# Patient Record
Sex: Female | Born: 1945 | Race: Black or African American | Hispanic: No | Smoking: Never smoker
Health system: Southern US, Community
[De-identification: ages and names within clinical notes are randomized; demographics above are authoritative.]

## PROBLEM LIST (undated history)

## (undated) DIAGNOSIS — I10 Essential (primary) hypertension: Secondary | ICD-10-CM

## (undated) DIAGNOSIS — E119 Type 2 diabetes mellitus without complications: Secondary | ICD-10-CM

## (undated) DIAGNOSIS — G5602 Carpal tunnel syndrome, left upper limb: Secondary | ICD-10-CM

## (undated) DIAGNOSIS — E039 Hypothyroidism, unspecified: Secondary | ICD-10-CM

## (undated) DIAGNOSIS — H269 Unspecified cataract: Secondary | ICD-10-CM

## (undated) DIAGNOSIS — M1990 Unspecified osteoarthritis, unspecified site: Secondary | ICD-10-CM

## (undated) DIAGNOSIS — E785 Hyperlipidemia, unspecified: Secondary | ICD-10-CM

## (undated) DIAGNOSIS — R011 Cardiac murmur, unspecified: Secondary | ICD-10-CM

## (undated) HISTORY — PX: JOINT REPLACEMENT: SHX530

## (undated) HISTORY — DX: Hyperlipidemia, unspecified: E78.5

## (undated) HISTORY — DX: Unspecified osteoarthritis, unspecified site: M19.90

## (undated) HISTORY — DX: Unspecified cataract: H26.9

## (undated) HISTORY — PX: TUBAL LIGATION: SHX77

---

## 2000-03-28 HISTORY — PX: ROTATOR CUFF REPAIR: SHX139

## 2001-03-28 HISTORY — PX: TOTAL KNEE ARTHROPLASTY: SHX125

## 2007-03-29 HISTORY — PX: CARPAL TUNNEL RELEASE: SHX101

## 2007-07-12 LAB — HM COLONOSCOPY

## 2011-03-29 HISTORY — PX: ROTATOR CUFF REPAIR: SHX139

## 2013-05-02 ENCOUNTER — Other Ambulatory Visit (HOSPITAL_COMMUNITY): Payer: Self-pay | Admitting: Family Medicine

## 2013-05-02 DIAGNOSIS — M810 Age-related osteoporosis without current pathological fracture: Secondary | ICD-10-CM

## 2013-05-02 DIAGNOSIS — Z139 Encounter for screening, unspecified: Secondary | ICD-10-CM

## 2013-05-07 ENCOUNTER — Ambulatory Visit (HOSPITAL_COMMUNITY): Payer: Self-pay

## 2013-05-09 ENCOUNTER — Other Ambulatory Visit (HOSPITAL_COMMUNITY): Payer: Self-pay

## 2013-05-09 ENCOUNTER — Ambulatory Visit (HOSPITAL_COMMUNITY): Payer: Self-pay

## 2013-05-13 ENCOUNTER — Ambulatory Visit (HOSPITAL_COMMUNITY): Payer: Self-pay

## 2013-05-14 ENCOUNTER — Other Ambulatory Visit (HOSPITAL_COMMUNITY): Payer: Self-pay

## 2013-05-14 ENCOUNTER — Ambulatory Visit (HOSPITAL_COMMUNITY): Payer: Self-pay

## 2013-05-23 ENCOUNTER — Other Ambulatory Visit (HOSPITAL_COMMUNITY): Payer: Self-pay

## 2013-05-30 ENCOUNTER — Ambulatory Visit (HOSPITAL_COMMUNITY)
Admission: RE | Admit: 2013-05-30 | Discharge: 2013-05-30 | Disposition: A | Discharge status: Home / Self Care | Payer: PRIVATE HEALTH INSURANCE | Source: Ambulatory Visit | Attending: Family Medicine | Admitting: Family Medicine

## 2013-05-30 DIAGNOSIS — M818 Other osteoporosis without current pathological fracture: Secondary | ICD-10-CM | POA: Insufficient documentation

## 2013-05-30 DIAGNOSIS — Z1231 Encounter for screening mammogram for malignant neoplasm of breast: Secondary | ICD-10-CM | POA: Insufficient documentation

## 2013-05-30 DIAGNOSIS — Z780 Asymptomatic menopausal state: Secondary | ICD-10-CM | POA: Insufficient documentation

## 2013-05-30 DIAGNOSIS — M810 Age-related osteoporosis without current pathological fracture: Secondary | ICD-10-CM

## 2013-05-30 DIAGNOSIS — Z139 Encounter for screening, unspecified: Secondary | ICD-10-CM

## 2013-07-11 ENCOUNTER — Other Ambulatory Visit (HOSPITAL_COMMUNITY): Payer: Self-pay | Admitting: Family Medicine

## 2013-07-11 ENCOUNTER — Ambulatory Visit (HOSPITAL_COMMUNITY)
Admission: RE | Admit: 2013-07-11 | Discharge: 2013-07-11 | Disposition: A | Discharge status: Home / Self Care | Payer: PRIVATE HEALTH INSURANCE | Source: Ambulatory Visit | Attending: Family Medicine | Admitting: Family Medicine

## 2013-07-11 DIAGNOSIS — R209 Unspecified disturbances of skin sensation: Secondary | ICD-10-CM | POA: Insufficient documentation

## 2013-07-11 DIAGNOSIS — M542 Cervicalgia: Secondary | ICD-10-CM

## 2013-07-11 DIAGNOSIS — M47812 Spondylosis without myelopathy or radiculopathy, cervical region: Secondary | ICD-10-CM | POA: Insufficient documentation

## 2013-07-11 DIAGNOSIS — R29898 Other symptoms and signs involving the musculoskeletal system: Secondary | ICD-10-CM | POA: Insufficient documentation

## 2014-01-13 ENCOUNTER — Ambulatory Visit (HOSPITAL_COMMUNITY)
Admission: RE | Admit: 2014-01-13 | Discharge: 2014-01-13 | Disposition: A | Discharge status: Home / Self Care | Payer: PRIVATE HEALTH INSURANCE | Source: Ambulatory Visit | Attending: Nurse Practitioner | Admitting: Nurse Practitioner

## 2014-01-13 DIAGNOSIS — R079 Chest pain, unspecified: Secondary | ICD-10-CM | POA: Insufficient documentation

## 2014-01-13 DIAGNOSIS — I081 Rheumatic disorders of both mitral and tricuspid valves: Secondary | ICD-10-CM | POA: Insufficient documentation

## 2014-01-13 DIAGNOSIS — E119 Type 2 diabetes mellitus without complications: Secondary | ICD-10-CM | POA: Insufficient documentation

## 2014-01-13 DIAGNOSIS — I517 Cardiomegaly: Secondary | ICD-10-CM

## 2014-01-13 DIAGNOSIS — I10 Essential (primary) hypertension: Secondary | ICD-10-CM | POA: Insufficient documentation

## 2014-01-13 DIAGNOSIS — E785 Hyperlipidemia, unspecified: Secondary | ICD-10-CM | POA: Diagnosis not present

## 2014-01-13 NOTE — Progress Notes (Signed)
  Echocardiogram 2D Echocardiogram has been performed.  Kathleen [name] 01/13/2014, 11:29 AM

## 2014-02-17 ENCOUNTER — Encounter: Payer: Self-pay | Admitting: Internal Medicine

## 2014-02-17 ENCOUNTER — Ambulatory Visit (INDEPENDENT_AMBULATORY_CARE_PROVIDER_SITE_OTHER): Payer: PRIVATE HEALTH INSURANCE | Admitting: Internal Medicine

## 2014-02-17 VITALS — BP 138/90 | HR 83 | Ht 62.0 in | Wt 182.0 lb

## 2014-02-17 DIAGNOSIS — R0602 Shortness of breath: Secondary | ICD-10-CM

## 2014-02-17 NOTE — Progress Notes (Signed)
HPI Patinet is a 68 yo who is referred for CP.  Patinet has a history of DM, HL, HTN, Vit D deficiency  She is Jehovah's witness Echo in October 2015 showed LVEF 60 to 65% Moderate LVH     Pain comes on at rest  Pain is not often  Starts as soft discomfort  Then heaviness.  Last a couple min.  Not pleuritic.  Have gone on for about a year  Again not often  None with activity.  No PND Not too active   Doesn't walk much  Gets SOB  SOB  Started this year   2 maternal uncles with heart disease.  Mother developed in later years Not on File  Current Outpatient Prescriptions  Medication Sig Dispense Refill  . amLODipine-benazepril (LOTREL) 5-20 MG per capsule Take 1 capsule by mouth daily.     Marland Kitchen. aspirin 81 MG tablet Take 81 mg by mouth daily.    . Calcium Carbonate-Vitamin D (CALCIUM 500/D PO) Take by mouth.    . Cholecalciferol (VITAMIN D3) 2000 UNITS TABS Take by mouth.    Marland Kitchen. JANUMET 50-500 MG per tablet Take 1 tablet by mouth daily.     Marland Kitchen. levothyroxine (SYNTHROID, LEVOTHROID) 100 MCG tablet Take 100 mcg by mouth daily before breakfast.     . simvastatin (ZOCOR) 20 MG tablet Take 20 mg by mouth daily.     Marland Kitchen. triamterene-hydrochlorothiazide (DYAZIDE) 37.5-25 MG per capsule Take 1 capsule by mouth daily.      No current facility-administered medications for this visit.    No past medical history on file.  No past surgical history on file.  Knee surgery replaced bilateral  Shoulder surgery  R carpel tunnel    No family history on file.  History   Social History  . Marital Status: Divorced    Spouse Name: N/A    Number of Children: N/A  . Years of Education: N/A   Occupational History  . Not on file.   Social History Main Topics  . Smoking status: Never Smoker   . Smokeless tobacco: Never Used  . Alcohol Use: Not on file  . Drug Use: Not on file  . Sexual Activity: Not on file   Other Topics Concern  . Not on file   Social History Narrative  . No narrative on file    Review  of Systems:  All systems reviewed.  They are negative to the above problem except as previously stated.  Vital Signs: BP 138/90 mmHg  Pulse 83  Ht 5\' 2"  (1.575 m)  Wt 182 lb (82.555 kg)  BMI 33.28 kg/m2  Physical Exam patinet is in NAD    NT:  Normocephalic, atraumatic. EOMI, PERRLA.  Neck: JVP is normal.  No bruits.  Lungs: clear to auscultation. No rales no wheezes.  Heart: Regular rate and rhythm. Normal S1, S2. No S3.   II/VI systlic murmur at apex and base  PMI not displaced.  Abdomen:  Supple, nontender. Normal bowel sounds. No masses. No hepatomegaly.  Extremities:   Good distal pulses throughout. No lower extremity edema.  Musculoskeletal :moving all extremities.  Neuro:   alert and oriented x3.  CN II-XII grossly intact.  EKG  Not done  WIll get at stress tst    Assessment and Plan:  1  CP  Atypical  I do  Not think cardiac   2.  Dypsnea  More concerning  Discussed with patinet  Would recomm Lexiscan myoview to r/o ischemia  3  HTN  BP is borderline  WIll need to be followe  4.  HL  Do not have lipids  Continue simvistatin

## 2014-02-17 NOTE — Patient Instructions (Signed)
Your physician recommends that you schedule a follow-up appointment in: to be determined after test    Your physician recommends that you continue on your current medications as directed. Please refer to the Current Medication list given to you today.    Your physician has requested that you have a lexiscan myoview. For further information please visit [email or web address]. Please follow instruction sheet, as given.     Thank you for choosing [facility] !         

## 2014-02-28 ENCOUNTER — Encounter (HOSPITAL_COMMUNITY)
Admission: RE | Admit: 2014-02-28 | Discharge: 2014-02-28 | Disposition: A | Discharge status: Home / Self Care | Payer: PRIVATE HEALTH INSURANCE | Source: Ambulatory Visit | Attending: Internal Medicine | Admitting: Internal Medicine

## 2014-02-28 ENCOUNTER — Ambulatory Visit (HOSPITAL_COMMUNITY)
Admission: RE | Admit: 2014-02-28 | Discharge: 2014-02-28 | Disposition: A | Discharge status: Home / Self Care | Payer: PRIVATE HEALTH INSURANCE | Source: Ambulatory Visit | Attending: Internal Medicine | Admitting: Internal Medicine

## 2014-02-28 ENCOUNTER — Encounter (HOSPITAL_COMMUNITY): Payer: Self-pay

## 2014-02-28 DIAGNOSIS — R0789 Other chest pain: Secondary | ICD-10-CM | POA: Insufficient documentation

## 2014-02-28 DIAGNOSIS — E119 Type 2 diabetes mellitus without complications: Secondary | ICD-10-CM | POA: Diagnosis not present

## 2014-02-28 DIAGNOSIS — R079 Chest pain, unspecified: Secondary | ICD-10-CM | POA: Diagnosis not present

## 2014-02-28 DIAGNOSIS — I10 Essential (primary) hypertension: Secondary | ICD-10-CM | POA: Insufficient documentation

## 2014-02-28 DIAGNOSIS — R0602 Shortness of breath: Secondary | ICD-10-CM | POA: Insufficient documentation

## 2014-02-28 DIAGNOSIS — E785 Hyperlipidemia, unspecified: Secondary | ICD-10-CM | POA: Diagnosis not present

## 2014-02-28 MED ORDER — TECHNETIUM TC 99M SESTAMIBI - CARDIOLITE
10.0000 | Freq: Once | INTRAVENOUS | Status: AC | PRN
  Administered 2014-02-28: 09:00:00 10 via INTRAVENOUS

## 2014-02-28 MED ORDER — TECHNETIUM TC 99M SESTAMIBI GENERIC - CARDIOLITE
30.0000 | Freq: Once | INTRAVENOUS | Status: AC | PRN
  Administered 2014-02-28: 30 via INTRAVENOUS

## 2014-02-28 MED ORDER — SODIUM CHLORIDE 0.9 % IJ SOLN
10.0000 mL | INTRAMUSCULAR | Status: DC | PRN
  Administered 2014-02-28: 10 mL via INTRAVENOUS
  Filled 2014-02-28: qty 10

## 2014-02-28 MED ORDER — REGADENOSON 0.4 MG/5ML IV SOLN
0.4000 mg | Freq: Once | INTRAVENOUS | Status: AC
  Administered 2014-02-28: 0.4 mg via INTRAVENOUS

## 2014-02-28 NOTE — Progress Notes (Signed)
Stress Lab Nurses Notes - [facility]  Margot AblesBetty O Putnam 02/28/2014 Reason for doing test: Chest Pain Type of test: Marlane HatcherLexiscan Cardiolite Nurse performing test: Santiago BurElizabeth Ashli Selders, RN Nuclear Medicine Tech: Lyndel Pleasureyan Liles Echo Tech: Not Applicable MD performing test: S. McDowell/K. Lyman BishopLawrence, NP Family MD: Dr. Tenny Crawoss Test explained and consent signed: Yes.   IV started: Saline lock flushed Symptoms: Chest tightness and stomach discomfort. Treatment/Intervention: None Reason test stopped: protocol completed After recovery IV was: Discontinued via X-ray tech and No redness or edema Patient to return to Nuc. Med at : 1200 Patient discharged: Home Patient's Condition upon discharge was: stable Comments: during test BP 127/66 & HR 92.  Recovery BP 132/62 & HR 78. Symptoms resolved in recovery.  Bosie HelperHandy, Amaryllis Malmquist Noel

## 2014-04-11 DIAGNOSIS — I700 Atherosclerosis of aorta: Secondary | ICD-10-CM | POA: Diagnosis not present

## 2014-04-11 DIAGNOSIS — I10 Essential (primary) hypertension: Secondary | ICD-10-CM | POA: Diagnosis not present

## 2014-04-11 DIAGNOSIS — E785 Hyperlipidemia, unspecified: Secondary | ICD-10-CM | POA: Diagnosis not present

## 2014-04-11 DIAGNOSIS — R079 Chest pain, unspecified: Secondary | ICD-10-CM | POA: Diagnosis not present

## 2014-04-11 DIAGNOSIS — E119 Type 2 diabetes mellitus without complications: Secondary | ICD-10-CM | POA: Diagnosis not present

## 2014-06-19 NOTE — Patient Instructions (Signed)
Your procedure is scheduled on: 06/26/2014  [facility]  [facility]  AM.  Call this number if you have problems the morning of surgery: [phone]   Do not eat food or drink liquids :After Midnight.      Take these medicines the morning of surgery with A SIP OF WATER: synthroid, dyazide, amlodipine   Do not wear jewelry, make-up or nail polish.  Do not wear lotions, powders, or perfumes.   Do not shave 48 hours prior to surgery.  Do not bring valuables to the hospital.  Contacts, dentures or bridgework may not be worn into surgery.  Leave suitcase in the car. After surgery it may be brought to your room.  For patients admitted to the hospital, checkout time is 11:00 AM the day of discharge.   Patients discharged the day of surgery will not be allowed to drive home.  :     Please read over the following fact sheets that you were given: Coughing and Deep Breathing, Surgical Site Infection Prevention, Anesthesia Post-op Instructions and Care and Recovery After Surgery    Cataract A cataract is a clouding of the lens of the eye. When a lens becomes cloudy, vision is reduced based on the degree and nature of the clouding. Many cataracts reduce vision to some degree. Some cataracts make people more near-sighted as they develop. Other cataracts increase glare. Cataracts that are ignored and become worse can sometimes look white. The white color can be seen through the pupil. CAUSES   Aging. However, cataracts may occur at any age, even in newborns.   Certain drugs.   Trauma to the eye.   Certain diseases such as diabetes.   Specific eye diseases such as chronic inflammation inside the eye or a sudden attack of a rare form of glaucoma.   Inherited or acquired medical problems.  SYMPTOMS   Gradual, progressive drop in vision in the affected eye.   Severe, rapid visual loss. This most often happens when trauma is the cause.  DIAGNOSIS  To detect a cataract, an eye doctor  examines the lens. Cataracts are best diagnosed with an exam of the eyes with the pupils enlarged (dilated) by drops.  TREATMENT  For an early cataract, vision may improve by using different eyeglasses or stronger lighting. If that does not help your vision, surgery is the only effective treatment. A cataract needs to be surgically removed when vision loss interferes with your everyday activities, such as driving, reading, or watching TV. A cataract may also have to be removed if it prevents examination or treatment of another eye problem. Surgery removes the cloudy lens and usually replaces it with a substitute lens (intraocular lens, IOL).  At a time when both you and your doctor agree, the cataract will be surgically removed. If you have cataracts in both eyes, only one is usually removed at a time. This allows the operated eye to heal and be out of danger from any possible problems after surgery (such as infection or poor wound healing). In rare cases, a cataract may be doing damage to your eye. In these cases, your caregiver may advise surgical removal right away. The vast majority of people who have cataract surgery have better vision afterward. HOME CARE INSTRUCTIONS  If you are not planning surgery, you may be asked to do the following:  Use different eyeglasses.   Use stronger or brighter lighting.   Ask your eye doctor about reducing your medicine dose or changing  medicines if it is thought that a medicine caused your cataract. Changing medicines does not make the cataract go away on its own.   Become familiar with your surroundings. Poor vision can lead to injury. Avoid bumping into things on the affected side. You are at a higher risk for tripping or falling.   Exercise extreme care when driving or operating machinery.   Wear sunglasses if you are sensitive to bright [place] or experiencing problems with glare.  SEEK IMMEDIATE MEDICAL CARE IF:   You have a worsening or sudden vision  loss.   You notice redness, swelling, or increasing pain in the eye.   You have a fever.  Document Released: 03/14/2005 Document Revised: 03/03/2011 Document Reviewed: 11/05/2010 [facility] Patient Information 2012 [email or web address] INSTRUCTIONS POST-ANESTHESIA  IMMEDIATELY FOLLOWING SURGERY:  Do not drive or operate machinery for the first twenty four hours after surgery.  Do not make any important decisions for twenty four hours after surgery or while taking narcotic pain medications or sedatives.  If you develop intractable nausea and vomiting or a severe headache please notify your doctor immediately.  FOLLOW-UP:  Please make an appointment with your surgeon as instructed. You do not need to follow up with anesthesia unless specifically instructed to do so.  WOUND CARE INSTRUCTIONS (if applicable):  Keep a dry clean dressing on the anesthesia/puncture wound site if there is drainage.  Once the wound has quit draining you may leave it open to air.  Generally you should leave the bandage intact for twenty four hours unless there is drainage.  If the epidural site drains for more than 36-48 hours please call the anesthesia department.  QUESTIONS?:  Please feel free to call your physician or the hospital operator if you have any questions, and they will be happy to assist you.

## 2014-06-24 ENCOUNTER — Encounter (HOSPITAL_COMMUNITY)
Admission: RE | Admit: 2014-06-24 | Discharge: 2014-06-24 | Disposition: A | Discharge status: Home / Self Care | Payer: Medicare Other | Source: Ambulatory Visit | Attending: Ophthalmology | Admitting: Ophthalmology

## 2014-06-24 ENCOUNTER — Encounter (HOSPITAL_COMMUNITY): Payer: Self-pay

## 2014-06-24 ENCOUNTER — Other Ambulatory Visit: Payer: Self-pay

## 2014-06-24 DIAGNOSIS — H25812 Combined forms of age-related cataract, left eye: Secondary | ICD-10-CM | POA: Diagnosis not present

## 2014-06-24 HISTORY — DX: Cardiac murmur, unspecified: R01.1

## 2014-06-24 HISTORY — DX: Hypothyroidism, unspecified: E03.9

## 2014-06-24 HISTORY — DX: Type 2 diabetes mellitus without complications: E11.9

## 2014-06-24 HISTORY — DX: Essential (primary) hypertension: I10

## 2014-06-24 LAB — CBC
HCT: 42 % (ref 36.0–46.0)
Hemoglobin: 14.1 g/dL (ref 12.0–15.0)
MCH: 30.5 pg (ref 26.0–34.0)
MCHC: 33.6 g/dL (ref 30.0–36.0)
MCV: 90.7 fL (ref 78.0–100.0)
Platelets: 464 10*3/uL — ABNORMAL HIGH (ref 150–400)
RBC: 4.63 MIL/uL (ref 3.87–5.11)
RDW: 14.6 % (ref 11.5–15.5)
WBC: 9.6 10*3/uL (ref 4.0–10.5)

## 2014-06-24 LAB — BASIC METABOLIC PANEL
Anion gap: 8 (ref 5–15)
BUN: 13 mg/dL (ref 6–23)
CHLORIDE: 103 mmol/L (ref 96–112)
CO2: 27 mmol/L (ref 19–32)
CREATININE: 0.78 mg/dL (ref 0.50–1.10)
Calcium: 10.4 mg/dL (ref 8.4–10.5)
GFR calc Af Amer: >90 mL/min (ref >90)
GFR calc non Af Amer: 83 mL/min — ABNORMAL LOW (ref >90)
GLUCOSE: 82 mg/dL (ref 70–99)
Potassium: 4.4 mmol/L (ref 3.5–5.1)
SODIUM: 138 mmol/L (ref 135–145)

## 2014-06-25 MED ORDER — CYCLOPENTOLATE-PHENYLEPHRINE OP SOLN OPTIME - NO CHARGE
OPHTHALMIC | Status: AC
  Filled 2014-06-25: qty 2

## 2014-06-25 MED ORDER — NEOMYCIN-POLYMYXIN-DEXAMETH 3.5-10000-0.1 OP SUSP
OPHTHALMIC | Status: AC
  Filled 2014-06-25: qty 5

## 2014-06-25 MED ORDER — TETRACAINE HCL 0.5 % OP SOLN
OPHTHALMIC | Status: AC
  Filled 2014-06-25: qty 2

## 2014-06-25 MED ORDER — LIDOCAINE HCL (PF) 1 % IJ SOLN
INTRAMUSCULAR | Status: AC
  Filled 2014-06-25: qty 2

## 2014-06-25 MED ORDER — LIDOCAINE HCL 3.5 % OP GEL
OPHTHALMIC | Status: AC
  Filled 2014-06-25: qty 1

## 2014-06-25 MED ORDER — PHENYLEPHRINE HCL 2.5 % OP SOLN
OPHTHALMIC | Status: AC
  Filled 2014-06-25: qty 15

## 2014-06-26 ENCOUNTER — Ambulatory Visit (HOSPITAL_COMMUNITY): Payer: Medicare Other | Admitting: Anesthesiology

## 2014-06-26 ENCOUNTER — Encounter (HOSPITAL_COMMUNITY)
Admission: RE | Disposition: A | Discharge status: Home / Self Care | Payer: Self-pay | Source: Ambulatory Visit | Attending: Ophthalmology

## 2014-06-26 ENCOUNTER — Encounter (HOSPITAL_COMMUNITY): Payer: Self-pay | Admitting: Ophthalmology

## 2014-06-26 ENCOUNTER — Ambulatory Visit (HOSPITAL_COMMUNITY)
Admission: RE | Admit: 2014-06-26 | Discharge: 2014-06-26 | Disposition: A | Discharge status: Home / Self Care | Payer: Medicare Other | Source: Ambulatory Visit | Attending: Ophthalmology | Admitting: Ophthalmology

## 2014-06-26 DIAGNOSIS — H25812 Combined forms of age-related cataract, left eye: Secondary | ICD-10-CM | POA: Diagnosis not present

## 2014-06-26 HISTORY — PX: CATARACT EXTRACTION W/PHACO: SHX586

## 2014-06-26 LAB — GLUCOSE, CAPILLARY: GLUCOSE-CAPILLARY: 117 mg/dL — AB (ref 70–99)

## 2014-06-26 SURGERY — PHACOEMULSIFICATION, CATARACT, WITH IOL INSERTION
Anesthesia: Monitor Anesthesia Care | Site: Eye | Laterality: Left

## 2014-06-26 MED ORDER — LIDOCAINE HCL (PF) 1 % IJ SOLN
INTRAMUSCULAR | Status: DC | PRN
  Administered 2014-06-26: .5 mL

## 2014-06-26 MED ORDER — MIDAZOLAM HCL 2 MG/2ML IJ SOLN
INTRAMUSCULAR | Status: AC
  Filled 2014-06-26: qty 2

## 2014-06-26 MED ORDER — LIDOCAINE HCL 3.5 % OP GEL
1.0000 "application " | Freq: Once | OPHTHALMIC | Status: AC
  Administered 2014-06-26: 1 via OPHTHALMIC

## 2014-06-26 MED ORDER — FENTANYL CITRATE 0.05 MG/ML IJ SOLN
25.0000 ug | INTRAMUSCULAR | Status: AC
  Administered 2014-06-26 (×2): 25 ug via INTRAVENOUS

## 2014-06-26 MED ORDER — POVIDONE-IODINE 5 % OP SOLN
OPHTHALMIC | Status: DC | PRN
  Administered 2014-06-26: 1 via OPHTHALMIC

## 2014-06-26 MED ORDER — PROVISC 10 MG/ML IO SOLN
INTRAOCULAR | Status: DC | PRN
  Administered 2014-06-26: 0.85 mL via INTRAOCULAR

## 2014-06-26 MED ORDER — EPINEPHRINE HCL 1 MG/ML IJ SOLN
INTRAMUSCULAR | Status: DC | PRN
  Administered 2014-06-26: 500 mL

## 2014-06-26 MED ORDER — BSS IO SOLN
INTRAOCULAR | Status: DC | PRN
  Administered 2014-06-26: 15 mL via INTRAOCULAR

## 2014-06-26 MED ORDER — PHENYLEPHRINE HCL 2.5 % OP SOLN
1.0000 [drp] | OPHTHALMIC | Status: AC
  Administered 2014-06-26 (×3): 1 [drp] via OPHTHALMIC

## 2014-06-26 MED ORDER — LACTATED RINGERS IV SOLN
INTRAVENOUS | Status: DC
  Administered 2014-06-26: 12:00:00 via INTRAVENOUS

## 2014-06-26 MED ORDER — TETRACAINE HCL 0.5 % OP SOLN
1.0000 [drp] | OPHTHALMIC | Status: AC
  Administered 2014-06-26 (×3): 1 [drp] via OPHTHALMIC

## 2014-06-26 MED ORDER — MIDAZOLAM HCL 2 MG/2ML IJ SOLN
1.0000 mg | INTRAMUSCULAR | Status: DC | PRN
Start: 2014-06-26 — End: 2014-06-26
  Administered 2014-06-26: 2 mg via INTRAVENOUS

## 2014-06-26 MED ORDER — CYCLOPENTOLATE-PHENYLEPHRINE 0.2-1 % OP SOLN
1.0000 [drp] | OPHTHALMIC | Status: AC
  Administered 2014-06-26 (×3): 1 [drp] via OPHTHALMIC

## 2014-06-26 MED ORDER — NEOMYCIN-POLYMYXIN-DEXAMETH 3.5-10000-0.1 OP SUSP
OPHTHALMIC | Status: DC | PRN
  Administered 2014-06-26: 1 [drp] via OPHTHALMIC

## 2014-06-26 MED ORDER — FENTANYL CITRATE 0.05 MG/ML IJ SOLN
INTRAMUSCULAR | Status: AC
  Filled 2014-06-26: qty 2

## 2014-06-26 SURGICAL SUPPLY — 12 items

## 2014-06-26 NOTE — Transfer of Care (Signed)
Immediate Anesthesia Transfer of Care Note  Patient: Margot AblesBetty O Oka  Procedure(s) Performed: Procedure(s) with comments: CATARACT EXTRACTION PHACO AND INTRAOCULAR LENS PLACEMENT (IOC) (Left) - CDE:8.77  Patient Location: Short Stay  Anesthesia Type:MAC  Level of Consciousness: awake, alert , oriented and patient cooperative  Airway & Oxygen Therapy: Patient Spontanous Breathing  Post-op Assessment: Report given to RN, Post -op Vital signs reviewed and stable and Patient moving all extremities  Post vital signs: Reviewed and stable  Last Vitals:  Filed Vitals:   06/26/14 1200  BP: 95/53  Pulse:   Temp:   Resp: 27    Complications: No apparent anesthesia complications

## 2014-06-26 NOTE — Op Note (Signed)
Date of Admission: 06/26/2014  Date of Surgery: 06/26/2014   Pre-Op Dx: Cataract Left Eye  Post-Op Dx: Senile Combined Cataract Left  Eye,  Dx Code Z61.096H25.812  Surgeon: Gemma PayorKerry Johnatha Zeidman, M.D.  Assistants: None  Anesthesia: Topical with MAC  Indications: Painless, progressive loss of vision with compromise of daily activities.  Surgery: Cataract Extraction with Intraocular lens Implant Left Eye  Discription: The patient had dilating drops and viscous lidocaine placed into the Left eye in the pre-op holding area. After transfer to the operating room, a time out was performed. The patient was then prepped and draped. Beginning with a 75 degree blade a paracentesis port was made at the surgeon's 2 o'clock position. The anterior chamber was then filled with 1% non-preserved lidocaine. This was followed by filling the anterior chamber with Provisc.  A 2.344mm keratome blade was used to make a clear corneal incision at the temporal limbus.  A bent cystatome needle was used to create a continuous tear capsulotomy. Hydrodissection was performed with balanced salt solution on a Fine canula. The lens nucleus was then removed using the phacoemulsification handpiece. Residual cortex was removed with the I&A handpiece. The anterior chamber and capsular bag were refilled with Provisc. A posterior chamber intraocular lens was placed into the capsular bag with it's injector. The implant was positioned with the Kuglan hook. The Provisc was then removed from the anterior chamber and capsular bag with the I&A handpiece. Stromal hydration of the main incision and paracentesis port was performed with BSS on a Fine canula. The wounds were tested for leak which was negative. The patient tolerated the procedure well. There were no operative complications. The patient was then transferred to the recovery room in stable condition.  Complications: None  Specimen: None  EBL: None  Prosthetic device: Hoya iSert 250, power 19.5 D, SN  [id number].

## 2014-06-26 NOTE — H&P (Signed)
I have reviewed the H&P, the patient was re-examined, and I have identified no interval changes in medical condition and plan of care since the history and physical of record  

## 2014-06-26 NOTE — Discharge Instructions (Signed)

## 2014-06-26 NOTE — Anesthesia Postprocedure Evaluation (Signed)
  Anesthesia Post-op Note  Patient: Kathleen [name]  Procedure(s) Performed: Procedure(s) with comments: CATARACT EXTRACTION PHACO AND INTRAOCULAR LENS PLACEMENT (IOC) (Left) - CDE:8.77  Patient Location: Short Stay  Anesthesia Type:MAC  Level of Consciousness: awake, alert , oriented and patient cooperative  Airway and Oxygen Therapy: Patient Spontanous Breathing  Post-op Pain: none  Post-op Assessment: Post-op Vital signs reviewed, Patient's Cardiovascular Status Stable, Respiratory Function Stable and Patent Airway  Post-op Vital Signs: Reviewed and stable  Last Vitals:  Filed Vitals:   06/26/14 1200  BP: 95/53  Pulse:   Temp:   Resp: 27    Complications: No apparent anesthesia complications

## 2014-06-26 NOTE — Anesthesia Preprocedure Evaluation (Signed)
Anesthesia Evaluation  Patient identified by MRN, date of birth, ID band Patient awake    Reviewed: Allergy & Precautions, NPO status , Patient's Chart, lab work & pertinent test results  Airway Mallampati: II   Neck ROM: Full    Dental  (+) Teeth Intact   Pulmonary neg pulmonary ROS,  breath sounds clear to auscultation        Cardiovascular hypertension, Pt. on medications Rhythm:Regular Rate:Normal     Neuro/Psych    GI/Hepatic   Endo/Other  diabetes, Type 2, Oral Hypoglycemic AgentsHypothyroidism   Renal/GU      Musculoskeletal   Abdominal   Peds  Hematology   Anesthesia Other Findings   Reproductive/Obstetrics                             Anesthesia Physical Anesthesia Plan  ASA: III  Anesthesia Plan: MAC   Post-op Pain Management:    Induction: Intravenous  Airway Management Planned: Nasal Cannula  Additional Equipment:   Intra-op Plan:   Post-operative Plan:   Informed Consent: I have reviewed the patients History and Physical, chart, labs and discussed the procedure including the risks, benefits and alternatives for the proposed anesthesia with the patient or authorized representative who has indicated his/her understanding and acceptance.     Plan Discussed with:   Anesthesia Plan Comments:         Anesthesia Quick Evaluation

## 2014-06-27 ENCOUNTER — Encounter (HOSPITAL_COMMUNITY): Payer: Self-pay | Admitting: Ophthalmology

## 2014-06-30 ENCOUNTER — Other Ambulatory Visit (HOSPITAL_COMMUNITY): Payer: Self-pay | Admitting: Nurse Practitioner

## 2014-06-30 DIAGNOSIS — Z1231 Encounter for screening mammogram for malignant neoplasm of breast: Secondary | ICD-10-CM

## 2014-07-07 ENCOUNTER — Ambulatory Visit (HOSPITAL_COMMUNITY)
Admission: RE | Admit: 2014-07-07 | Discharge: 2014-07-07 | Disposition: A | Discharge status: Home / Self Care | Payer: Medicare Other | Source: Ambulatory Visit | Attending: Nurse Practitioner | Admitting: Nurse Practitioner

## 2014-07-07 DIAGNOSIS — Z1231 Encounter for screening mammogram for malignant neoplasm of breast: Secondary | ICD-10-CM | POA: Diagnosis present

## 2014-07-22 ENCOUNTER — Encounter (HOSPITAL_COMMUNITY): Payer: Self-pay

## 2014-07-22 ENCOUNTER — Encounter (HOSPITAL_COMMUNITY)
Admission: RE | Admit: 2014-07-22 | Discharge: 2014-07-22 | Disposition: A | Discharge status: Home / Self Care | Payer: Medicare Other | Source: Ambulatory Visit | Attending: Ophthalmology | Admitting: Ophthalmology

## 2014-07-23 MED ORDER — LIDOCAINE HCL 3.5 % OP GEL
OPHTHALMIC | Status: AC
  Filled 2014-07-23: qty 1

## 2014-07-23 MED ORDER — LIDOCAINE HCL (PF) 1 % IJ SOLN
INTRAMUSCULAR | Status: AC
  Filled 2014-07-23: qty 2

## 2014-07-23 MED ORDER — CYCLOPENTOLATE-PHENYLEPHRINE OP SOLN OPTIME - NO CHARGE
OPHTHALMIC | Status: AC
  Filled 2014-07-23: qty 2

## 2014-07-23 MED ORDER — NEOMYCIN-POLYMYXIN-DEXAMETH 3.5-10000-0.1 OP SUSP
OPHTHALMIC | Status: AC
  Filled 2014-07-23: qty 5

## 2014-07-23 MED ORDER — TETRACAINE HCL 0.5 % OP SOLN
OPHTHALMIC | Status: AC
  Filled 2014-07-23: qty 2

## 2014-07-23 MED ORDER — PHENYLEPHRINE HCL 2.5 % OP SOLN
OPHTHALMIC | Status: AC
  Filled 2014-07-23: qty 15

## 2014-07-24 ENCOUNTER — Ambulatory Visit (HOSPITAL_COMMUNITY): Payer: Medicare Other | Admitting: Anesthesiology

## 2014-07-24 ENCOUNTER — Encounter (HOSPITAL_COMMUNITY)
Admission: RE | Disposition: A | Discharge status: Home / Self Care | Payer: Self-pay | Source: Ambulatory Visit | Attending: Ophthalmology

## 2014-07-24 ENCOUNTER — Encounter (HOSPITAL_COMMUNITY): Payer: Self-pay | Admitting: *Deleted

## 2014-07-24 ENCOUNTER — Ambulatory Visit (HOSPITAL_COMMUNITY)
Admission: RE | Admit: 2014-07-24 | Discharge: 2014-07-24 | Disposition: A | Discharge status: Home / Self Care | Payer: Medicare Other | Source: Ambulatory Visit | Attending: Ophthalmology | Admitting: Ophthalmology

## 2014-07-24 DIAGNOSIS — Z79899 Other long term (current) drug therapy: Secondary | ICD-10-CM | POA: Insufficient documentation

## 2014-07-24 DIAGNOSIS — E780 Pure hypercholesterolemia: Secondary | ICD-10-CM | POA: Insufficient documentation

## 2014-07-24 DIAGNOSIS — M1990 Unspecified osteoarthritis, unspecified site: Secondary | ICD-10-CM | POA: Diagnosis not present

## 2014-07-24 DIAGNOSIS — E039 Hypothyroidism, unspecified: Secondary | ICD-10-CM | POA: Insufficient documentation

## 2014-07-24 DIAGNOSIS — E119 Type 2 diabetes mellitus without complications: Secondary | ICD-10-CM | POA: Insufficient documentation

## 2014-07-24 DIAGNOSIS — H25811 Combined forms of age-related cataract, right eye: Secondary | ICD-10-CM | POA: Diagnosis not present

## 2014-07-24 DIAGNOSIS — I10 Essential (primary) hypertension: Secondary | ICD-10-CM | POA: Insufficient documentation

## 2014-07-24 DIAGNOSIS — Z7982 Long term (current) use of aspirin: Secondary | ICD-10-CM | POA: Diagnosis not present

## 2014-07-24 DIAGNOSIS — H269 Unspecified cataract: Secondary | ICD-10-CM | POA: Diagnosis present

## 2014-07-24 HISTORY — PX: CATARACT EXTRACTION W/PHACO: SHX586

## 2014-07-24 LAB — GLUCOSE, CAPILLARY: Glucose-Capillary: 129 mg/dL — ABNORMAL HIGH (ref 70–99)

## 2014-07-24 SURGERY — PHACOEMULSIFICATION, CATARACT, WITH IOL INSERTION
Anesthesia: Monitor Anesthesia Care | Site: Eye | Laterality: Right

## 2014-07-24 MED ORDER — MIDAZOLAM HCL 2 MG/2ML IJ SOLN
INTRAMUSCULAR | Status: AC
  Filled 2014-07-24: qty 2

## 2014-07-24 MED ORDER — PROVISC 10 MG/ML IO SOLN
INTRAOCULAR | Status: DC | PRN
  Administered 2014-07-24: 0.85 mL via INTRAOCULAR

## 2014-07-24 MED ORDER — BSS IO SOLN
INTRAOCULAR | Status: DC | PRN
Start: 2014-07-24 — End: 2014-07-24
  Administered 2014-07-24: 15 mL

## 2014-07-24 MED ORDER — LIDOCAINE HCL 3.5 % OP GEL
OPHTHALMIC | Status: DC | PRN
  Administered 2014-07-24: 1 via OPHTHALMIC

## 2014-07-24 MED ORDER — LIDOCAINE HCL 3.5 % OP GEL
1.0000 "application " | Freq: Once | OPHTHALMIC | Status: AC
  Administered 2014-07-24: 1 via OPHTHALMIC

## 2014-07-24 MED ORDER — PHENYLEPHRINE HCL 2.5 % OP SOLN
1.0000 [drp] | OPHTHALMIC | Status: AC
  Administered 2014-07-24 (×3): 1 [drp] via OPHTHALMIC

## 2014-07-24 MED ORDER — EPINEPHRINE HCL 1 MG/ML IJ SOLN
INTRAMUSCULAR | Status: AC
  Filled 2014-07-24: qty 1

## 2014-07-24 MED ORDER — TETRACAINE HCL 0.5 % OP SOLN
1.0000 [drp] | OPHTHALMIC | Status: AC
  Administered 2014-07-24 (×3): 1 [drp] via OPHTHALMIC

## 2014-07-24 MED ORDER — FENTANYL CITRATE (PF) 100 MCG/2ML IJ SOLN
25.0000 ug | INTRAMUSCULAR | Status: AC
  Administered 2014-07-24 (×2): 25 ug via INTRAVENOUS

## 2014-07-24 MED ORDER — FENTANYL CITRATE (PF) 100 MCG/2ML IJ SOLN
INTRAMUSCULAR | Status: AC
  Filled 2014-07-24: qty 2

## 2014-07-24 MED ORDER — LACTATED RINGERS IV SOLN
INTRAVENOUS | Status: DC
  Administered 2014-07-24: 1000 mL via INTRAVENOUS

## 2014-07-24 MED ORDER — CYCLOPENTOLATE-PHENYLEPHRINE 0.2-1 % OP SOLN
1.0000 [drp] | OPHTHALMIC | Status: AC
  Administered 2014-07-24 (×3): 1 [drp] via OPHTHALMIC

## 2014-07-24 MED ORDER — BSS IO SOLN
INTRAOCULAR | Status: DC | PRN
  Administered 2014-07-24: 500 mL

## 2014-07-24 MED ORDER — POVIDONE-IODINE 5 % OP SOLN
OPHTHALMIC | Status: DC | PRN
  Administered 2014-07-24: 1 via OPHTHALMIC

## 2014-07-24 MED ORDER — NEOMYCIN-POLYMYXIN-DEXAMETH 3.5-10000-0.1 OP SUSP
OPHTHALMIC | Status: DC | PRN
  Administered 2014-07-24: 2 [drp] via OPHTHALMIC

## 2014-07-24 MED ORDER — LIDOCAINE HCL (PF) 1 % IJ SOLN
INTRAMUSCULAR | Status: DC | PRN
  Administered 2014-07-24: .6 mL

## 2014-07-24 MED ORDER — MIDAZOLAM HCL 2 MG/2ML IJ SOLN
1.0000 mg | INTRAMUSCULAR | Status: DC | PRN
  Administered 2014-07-24: 2 mg via INTRAVENOUS

## 2014-07-24 SURGICAL SUPPLY — 34 items
CAPSULAR TENSION RING-AMO (OPHTHALMIC RELATED) IMPLANT
CLOTH BEACON ORANGE TIMEOUT ST (SAFETY) ×3 IMPLANT
EYE SHIELD UNIVERSAL CLEAR (GAUZE/BANDAGES/DRESSINGS) ×3 IMPLANT
GLOVE BIO SURGEON STRL SZ 6.5 (GLOVE) IMPLANT
GLOVE BIO SURGEONS STRL SZ 6.5 (GLOVE)
GLOVE BIOGEL PI IND STRL 6.5 (GLOVE) ×1 IMPLANT
GLOVE BIOGEL PI IND STRL 7.0 (GLOVE) IMPLANT
GLOVE BIOGEL PI IND STRL 7.5 (GLOVE) IMPLANT
GLOVE BIOGEL PI INDICATOR 6.5 (GLOVE) ×2
GLOVE BIOGEL PI INDICATOR 7.0 (GLOVE)
GLOVE BIOGEL PI INDICATOR 7.5 (GLOVE)
GLOVE ECLIPSE 6.5 STRL STRAW (GLOVE) IMPLANT
GLOVE ECLIPSE 7.0 STRL STRAW (GLOVE) IMPLANT
GLOVE ECLIPSE 7.5 STRL STRAW (GLOVE) IMPLANT
GLOVE EXAM NITRILE LRG STRL (GLOVE) IMPLANT
GLOVE EXAM NITRILE MD LF STRL (GLOVE) IMPLANT
GLOVE SKINSENSE NS SZ6.5 (GLOVE)
GLOVE SKINSENSE NS SZ7.0 (GLOVE)
GLOVE SKINSENSE STRL SZ6.5 (GLOVE) IMPLANT
GLOVE SKINSENSE STRL SZ7.0 (GLOVE) IMPLANT
KIT VITRECTOMY (OPHTHALMIC RELATED) IMPLANT
PAD ARMBOARD 7.5X6 YLW CONV (MISCELLANEOUS) ×3 IMPLANT
PROC W NO LENS (INTRAOCULAR LENS)
PROC W SPEC LENS (INTRAOCULAR LENS)
PROCESS W NO LENS (INTRAOCULAR LENS) IMPLANT
PROCESS W SPEC LENS (INTRAOCULAR LENS) IMPLANT
RETRACTOR IRIS SIGHTPATH (OPHTHALMIC RELATED) IMPLANT
RING MALYGIN (MISCELLANEOUS) IMPLANT
SIGHTPATH CAT PROC W REG LENS (Ophthalmic Related) ×3 IMPLANT
SYRINGE LUER LOK 1CC (MISCELLANEOUS) ×3 IMPLANT
TAPE SURG TRANSPORE 1 IN (GAUZE/BANDAGES/DRESSINGS) ×1 IMPLANT
TAPE SURGICAL TRANSPORE 1 IN (GAUZE/BANDAGES/DRESSINGS) ×2
VISCOELASTIC ADDITIONAL (OPHTHALMIC RELATED) IMPLANT
WATER STERILE IRR 250ML POUR (IV SOLUTION) ×3 IMPLANT

## 2014-07-24 NOTE — Discharge Instructions (Signed)

## 2014-07-24 NOTE — Transfer of Care (Signed)
Immediate Anesthesia Transfer of Care Note  Patient: Kathleen [name]  Procedure(s) Performed: Procedure(s) with comments: CATARACT EXTRACTION PHACO AND INTRAOCULAR LENS PLACEMENT RIGHT EYE (Right) - CDE 8.01  Patient Location: Short Stay  Anesthesia Type:MAC  Level of Consciousness: awake, alert , oriented and patient cooperative  Airway & Oxygen Therapy: Patient Spontanous Breathing  Post-op Assessment: Report given to RN, Post -op Vital signs reviewed and stable and Patient moving all extremities  Post vital signs: Reviewed and stable  Last Vitals:  Filed Vitals:   07/24/14 0745  BP: 108/60  Temp:   Resp: 27    Complications: No apparent anesthesia complications

## 2014-07-24 NOTE — Anesthesia Postprocedure Evaluation (Signed)
  Anesthesia Post-op Note  Patient: Kathleen [name]  Procedure(s) Performed: Procedure(s) with comments: CATARACT EXTRACTION PHACO AND INTRAOCULAR LENS PLACEMENT RIGHT EYE (Right) - CDE 8.01  Patient Location: Short Stay  Anesthesia Type:MAC  Level of Consciousness: awake, alert , oriented and patient cooperative  Airway and Oxygen Therapy: Patient Spontanous Breathing  Post-op Pain: none  Post-op Assessment: Post-op Vital signs reviewed, Patient's Cardiovascular Status Stable, Respiratory Function Stable, Patent Airway and Pain level controlled  Post-op Vital Signs: Reviewed and stable  Last Vitals:  Filed Vitals:   07/24/14 0745  BP: 108/60  Temp:   Resp: 27    Complications: No apparent anesthesia complications

## 2014-07-24 NOTE — Consult Note (Signed)
I have reviewed the H&P, the patient was re-examined, and I have identified no interval changes in medical condition and plan of care since the history and physical of record  

## 2014-07-24 NOTE — Anesthesia Preprocedure Evaluation (Signed)
Anesthesia Evaluation  Patient identified by MRN, date of birth, ID band Patient awake    Reviewed: Allergy & Precautions, NPO status , Patient's Chart, lab work & pertinent test results  Airway Mallampati: II   Neck ROM: Full    Dental  (+) Teeth Intact   Pulmonary neg pulmonary ROS,  breath sounds clear to auscultation        Cardiovascular hypertension, Pt. on medications Rhythm:Regular Rate:Normal     Neuro/Psych    GI/Hepatic   Endo/Other  diabetes, Type 2, Oral Hypoglycemic AgentsHypothyroidism   Renal/GU      Musculoskeletal   Abdominal   Peds  Hematology   Anesthesia Other Findings   Reproductive/Obstetrics                             Anesthesia Physical Anesthesia Plan  ASA: III  Anesthesia Plan: MAC   Post-op Pain Management:    Induction: Intravenous  Airway Management Planned: Nasal Cannula  Additional Equipment:   Intra-op Plan:   Post-operative Plan:   Informed Consent: I have reviewed the patients History and Physical, chart, labs and discussed the procedure including the risks, benefits and alternatives for the proposed anesthesia with the patient or authorized representative who has indicated his/her understanding and acceptance.     Plan Discussed with:   Anesthesia Plan Comments:         Anesthesia Quick Evaluation

## 2014-07-24 NOTE — Op Note (Signed)
Date of Admission: 07/24/2014  Date of Surgery: 07/24/2014   Pre-Op Dx: Cataract Right Eye  Post-Op Dx: Senile Combined Cataract Right  Eye,  Dx Code Z61.096H25.811  Surgeon: Gemma PayorKerry Zanaya Baize, M.D.  Assistants: None  Anesthesia: Topical with MAC  Indications: Painless, progressive loss of vision with compromise of daily activities.  Surgery: Cataract Extraction with Intraocular lens Implant Right Eye  Discription: The patient had dilating drops and viscous lidocaine placed into the Right eye in the pre-op holding area. After transfer to the operating room, a time out was performed. The patient was then prepped and draped. Beginning with a 75 degree blade a paracentesis port was made at the surgeon's 2 o'clock position. The anterior chamber was then filled with 1% non-preserved lidocaine. This was followed by filling the anterior chamber with Provisc.  A 2.814mm keratome blade was used to make a clear corneal incision at the temporal limbus.  A bent cystatome needle was used to create a continuous tear capsulotomy. Hydrodissection was performed with balanced salt solution on a Fine canula. The lens nucleus was then removed using the phacoemulsification handpiece. Residual cortex was removed with the I&A handpiece. The anterior chamber and capsular bag were refilled with Provisc. A posterior chamber intraocular lens was placed into the capsular bag with it's injector. The implant was positioned with the Kuglan hook. The Provisc was then removed from the anterior chamber and capsular bag with the I&A handpiece. Stromal hydration of the main incision and paracentesis port was performed with BSS on a Fine canula. The wounds were tested for leak which was negative. The patient tolerated the procedure well. There were no operative complications. The patient was then transferred to the recovery room in stable condition.  Complications: None  Specimen: None  EBL: None  Prosthetic device: Hoya iSert 250, power 19.5D,  SN [id number].

## 2014-07-25 ENCOUNTER — Encounter (HOSPITAL_COMMUNITY): Payer: Self-pay | Admitting: Ophthalmology

## 2014-09-04 ENCOUNTER — Ambulatory Visit (INDEPENDENT_AMBULATORY_CARE_PROVIDER_SITE_OTHER): Payer: Medicare Other

## 2014-09-04 ENCOUNTER — Ambulatory Visit (INDEPENDENT_AMBULATORY_CARE_PROVIDER_SITE_OTHER): Payer: Medicare Other | Admitting: Orthopedic Surgery

## 2014-09-04 ENCOUNTER — Encounter: Payer: Self-pay | Admitting: Orthopedic Surgery

## 2014-09-04 VITALS — BP 150/74 | Ht 62.0 in | Wt 185.0 lb

## 2014-09-04 DIAGNOSIS — M75101 Unspecified rotator cuff tear or rupture of right shoulder, not specified as traumatic: Secondary | ICD-10-CM | POA: Diagnosis not present

## 2014-09-04 DIAGNOSIS — M25511 Pain in right shoulder: Secondary | ICD-10-CM | POA: Diagnosis not present

## 2014-09-04 MED ORDER — TRAMADOL HCL 50 MG PO TABS: 50.0000 mg | ORAL_TABLET | Freq: Four times a day (QID) | ORAL | Status: DC | PRN

## 2014-09-04 MED ORDER — NAPROXEN 500 MG PO TABS: 500.0000 mg | ORAL_TABLET | Freq: Two times a day (BID) | ORAL | Status: DC

## 2014-09-04 NOTE — Patient Instructions (Signed)
Two new meds sent to [facility] exercises  Joint Injection Care After Refer to this sheet in the next few days. These instructions provide you with information on caring for yourself after you have had a joint injection. Your caregiver also may give you more specific instructions. Your treatment has been planned according to current medical practices, but problems sometimes occur. Call your caregiver if you have any problems or questions after your procedure. After any type of joint injection, it is not uncommon to experience:  Soreness, swelling, or bruising around the injection site.  Mild numbness, tingling, or weakness around the injection site caused by the numbing medicine used before or with the injection. It also is possible to experience the following effects associated with the specific agent after injection:  Iodine-based contrast agents:  Allergic reaction (itching, hives, widespread redness, and swelling beyond the injection site).  Corticosteroids (These effects are rare.):  Allergic reaction.  Increased blood sugar levels (If you have diabetes and you notice that your blood sugar levels have increased, notify your caregiver).  Increased blood pressure levels.  Mood swings.  Hyaluronic acid in the use of viscosupplementation.  Temporary heat or redness.  Temporary rash and itching.  Increased fluid accumulation in the injected joint. These effects all should resolve within a day after your procedure.  HOME CARE INSTRUCTIONS  Limit yourself to light activity the day of your procedure. Avoid lifting heavy objects, bending, stooping, or twisting.  Take prescription or over-the-counter pain medication as directed by your caregiver.  You may apply ice to your injection site to reduce pain and swelling the day of your procedure. Ice may be applied 03-04 times:  Put ice in a plastic bag.  Place a towel between your skin and the bag.  Leave the ice on for no  longer than 15-20 minutes each time. SEEK IMMEDIATE MEDICAL CARE IF:   Pain and swelling get worse rather than better or extend beyond the injection site.  Numbness does not go away.  Blood or fluid continues to leak from the injection site.  You have chest pain.  You have swelling of your face or tongue.  You have trouble breathing or you become dizzy.  You develop a fever, chills, or severe tenderness at the injection site that last longer than 1 day. MAKE SURE YOU:  Understand these instructions.  Watch your condition.  Get help right away if you are not doing well or if you get worse. Document Released: 11/25/2010 Document Revised: 06/06/2011 Document Reviewed: 11/25/2010 [facility] Patient Information 2015 [place], [place]. This information is not intended to replace advice given to you by your health care provider. Make sure you discuss any questions you have with your health care provider.

## 2014-09-04 NOTE — Progress Notes (Signed)
Patient ID: Kathleen [name], female   DOB: 1945/10/08, 69 y.o.   MRN: [id number]  Chief Complaint  Patient presents with  . Shoulder Pain    right shoulder pain, ref [facility].     Kathleen [name] is a 69 y.o. female.   HPI 62-year-old female had a right rotator cuff repair many years ago presents with right shoulder pain. She reports that it was atraumatic came on gradually and is associated with inability to sleep on her right side. She rates her pain as 6 out of 10 and constant. It was unrelieved by Tylenol. She says that moving it makes it worse and she feels crepitance in the right shoulder review of systems constipation heat intolerance vision problems ankle leg swelling numbness tingling joint pain and back pain Review of Systems See hpi  Past Medical History  Diagnosis Date  . Hypertension   . Heart murmur   . Diabetes mellitus without complication   . Hypothyroidism     Past Surgical History  Procedure Laterality Date  . Total knee arthroplasty Bilateral 2003  . Carpal tunnel release Right 2009  . Rotator cuff repair Right 2002  . Rotator cuff repair Left 2013  . Cataract extraction w/phaco Left 06/26/2014    Procedure: CATARACT EXTRACTION PHACO AND INTRAOCULAR LENS PLACEMENT (IOC);  Surgeon: Gemma Payor, MD;  Location: [facility];  Service: Ophthalmology;  Laterality: Left;  CDE:8.77  . Cataract extraction w/phaco Right 07/24/2014    Procedure: CATARACT EXTRACTION PHACO AND INTRAOCULAR LENS PLACEMENT RIGHT EYE;  Surgeon: Gemma Payor, MD;  Location: [facility];  Service: Ophthalmology;  Laterality: Right;  CDE 8.01    No family history on file.  Social History History  Substance Use Topics  . Smoking status: Never Smoker   . Smokeless tobacco: Never Used  . Alcohol Use: No    No Known Allergies  Current Outpatient Prescriptions  Medication Sig Dispense Refill  . amLODipine-benazepril (LOTREL) 5-20 MG per capsule Take 1 capsule by mouth daily.     Marland Kitchen aspirin 81 MG  tablet Take 81 mg by mouth daily.    Marland Kitchen BESIVANCE 0.6 % SUSP Place 1 drop into the left eye.     . Calcium Carbonate-Vitamin D (CALCIUM 500/D PO) Take 1 tablet by mouth daily.     . Cholecalciferol (VITAMIN D3) 2000 UNITS TABS Take 1 tablet by mouth daily.     . DUREZOL 0.05 % EMUL Place 1 drop into the left eye 3 (three) times daily.     Marland Kitchen JANUMET 50-500 MG per tablet Take 1 tablet by mouth daily.     Marland Kitchen levothyroxine (SYNTHROID, LEVOTHROID) 100 MCG tablet Take 100 mcg by mouth daily before breakfast.     . naproxen (NAPROSYN) 500 MG tablet Take 1 tablet (500 mg total) by mouth 2 (two) times daily with a meal. 60 tablet 0  . PROLENSA 0.07 % SOLN Place 1 drop into the left eye daily.     . simvastatin (ZOCOR) 20 MG tablet Take 20 mg by mouth daily.     . traMADol (ULTRAM) 50 MG tablet Take 1 tablet (50 mg total) by mouth every 6 (six) hours as needed. 120 tablet 0  . triamterene-hydrochlorothiazide (DYAZIDE) 37.5-25 MG per capsule Take 1 capsule by mouth daily.      No current facility-administered medications for this visit.       Physical Exam Blood pressure 150/74, height  (1.575 m), weight 185 lb (83.915 kg). Physical Exam  The patient is well developed well nourished and well groomed. Orientation to person place and time is normal  Mood is pleasant. Ambulatory status she is ambulatory but uses a cane she had a knee replacement years ago as well. The left shoulder has full range of motion stability and strength and there is no tenderness  The right shoulder is tender she has a scar from a cyst removal which looks like it has some hypertrophy and then she has a rotator cuff transverse scar from the coracoid to the acromion. That is nontender. She has mild tenderness over the right deltoid. She has full passive range of motion. She has painful supraspinatus tendon test against resistance and mild weakness. The shoulder remained stable the internal and external rotators are normal and  have grade 5 strength. She does have an impingement sign which is mildly positive. She does have crepitance on range of motion. She is otherwise neurovascularly intact and she has no evidence of lymphadenopathy in the right axilla or right supraclavicular region  Medication update she takes levothyroxine simvastatin triamterene during hydrochlorothiazide Janumet amlodipine with benazepril calcium aspirin iron and multivitamin  Data Reviewed I ordered an x-ray of her right shoulder Interpretation is before meals joint arthritis and greater tuberosity cyst formation and sclerosis with decreased humeral head acromial distance without change in Shenton's line of the shoulder  Assessment Encounter Diagnoses  Name Primary?  . Right shoulder pain Yes  . Rotator cuff syndrome of right shoulder     Plan Recommend subacromial injection Meds ordered this encounter  Medications  . naproxen (NAPROSYN) 500 MG tablet    Sig: Take 1 tablet (500 mg total) by mouth 2 (two) times daily with a meal.    Dispense:  60 tablet    Refill:  0  . traMADol (ULTRAM) 50 MG tablet    Sig: Take 1 tablet (50 mg total) by mouth every 6 (six) hours as needed.    Dispense:  120 tablet    Refill:  0    Home exercise program PEP for 8 weeks and then recheck with me

## 2014-09-04 NOTE — Addendum Note (Signed)
Addended by: Vickki Hearing on: 09/04/2014 09:52 AM   Modules accepted: Medications

## 2014-11-04 ENCOUNTER — Encounter: Payer: Self-pay | Admitting: Orthopedic Surgery

## 2014-11-04 ENCOUNTER — Ambulatory Visit (INDEPENDENT_AMBULATORY_CARE_PROVIDER_SITE_OTHER): Payer: Medicare Other | Admitting: Orthopedic Surgery

## 2014-11-04 VITALS — BP 151/73 | Ht 62.0 in | Wt 184.0 lb

## 2014-11-04 DIAGNOSIS — M25511 Pain in right shoulder: Secondary | ICD-10-CM

## 2014-11-04 DIAGNOSIS — Z96653 Presence of artificial knee joint, bilateral: Secondary | ICD-10-CM | POA: Diagnosis not present

## 2014-11-04 NOTE — Progress Notes (Signed)
Patient ID: Kathleen [name], female   DOB: [birth year], 69 y.o.   MRN: [id number]  Follow up visit  Chief Complaint  Patient presents with  . Follow-up    follow up right shoulder s/p injection, meds, home exercise    Ht  (1.575 m)  Encounter Diagnoses  Name Primary?  . Right shoulder pain Yes  . H/O total knee replacement, bilateral    Review of Systems  Constitutional: Negative for fever.  Musculoskeletal: Positive for back pain and joint pain.  Skin: Negative for itching and rash.       Negative for erythema  Neurological: Negative for tingling.    This is a follow-up visit regarding the patient's right shoulder which we treated with an injection in home exercise program and naproxen and tramadol she made significant improvement. Today's examination reveals no swelling she's regained full range of motion she does have some weakness in her rotator cuff but her shoulder is stable neurovascular exam is intact  She had a history of bilateral knee replacements she wants evaluation done regarding nose as well. She had knee replacements bilateral 13 years ago. She complains of bilateral knee pain diffusely is seems to be more tibial and then also on the back of the knee. She has some back pain as well but says it's nonradiating and is only related to the knee and that she feels pressure when she's standing. She is using a cane  Her vital signs are stable her appearance is normal she is oriented 3 her mood is pleasant again she is ambulatory with a cane. Both knees come to full extension but only get 90 of flexion she has 2 well-healed incisions without warmth or erythema both knees are stable in the coronal and sagittal plane she has intact extensor mechanisms. There is no joint effusion. Neurovascular exam is normal distally.  I will order bilateral knee x-rays she'll come back on the 25th for review of those  Our differential diagnosis is  wear of the polyethylene insert  referred  pain from another source

## 2014-11-07 ENCOUNTER — Other Ambulatory Visit: Payer: Self-pay | Admitting: Orthopedic Surgery

## 2014-11-07 ENCOUNTER — Ambulatory Visit (HOSPITAL_COMMUNITY)
Admission: RE | Admit: 2014-11-07 | Discharge: 2014-11-07 | Disposition: A | Discharge status: Home / Self Care | Payer: Medicare Other | Source: Ambulatory Visit | Attending: Orthopedic Surgery | Admitting: Orthopedic Surgery

## 2014-11-07 DIAGNOSIS — M25562 Pain in left knee: Secondary | ICD-10-CM | POA: Diagnosis not present

## 2014-11-07 DIAGNOSIS — M25561 Pain in right knee: Secondary | ICD-10-CM

## 2014-11-20 ENCOUNTER — Ambulatory Visit: Payer: Medicare Other | Admitting: Orthopedic Surgery

## 2014-12-08 ENCOUNTER — Encounter: Payer: Self-pay | Admitting: Orthopedic Surgery

## 2014-12-08 ENCOUNTER — Ambulatory Visit (INDEPENDENT_AMBULATORY_CARE_PROVIDER_SITE_OTHER): Payer: Medicare Other | Admitting: Orthopedic Surgery

## 2014-12-08 VITALS — BP 164/83 | Ht 62.0 in | Wt 184.0 lb

## 2014-12-08 DIAGNOSIS — M25561 Pain in right knee: Secondary | ICD-10-CM

## 2014-12-08 DIAGNOSIS — Z96653 Presence of artificial knee joint, bilateral: Secondary | ICD-10-CM | POA: Diagnosis not present

## 2014-12-08 DIAGNOSIS — M25562 Pain in left knee: Secondary | ICD-10-CM | POA: Diagnosis not present

## 2014-12-08 NOTE — Patient Instructions (Signed)
Will refer to Dr Charlann Boxer or Alusio for revision of bilateral TKA'S

## 2014-12-08 NOTE — Progress Notes (Signed)
Patient ID: Kathleen [name], female   DOB: Feb 06, 1946, 69 y.o.   MRN: [id number]  New  Chief Complaint  Patient presents with  . Knee Pain    bilateral knee pain    Hpi:  69 year old female status post bilateral total knees in 2003 in [place] at the age of 27. She presents with a history of diabetes hypertension hypothyroid disease complaining of bilateral knee pain right worse than left which started 4 months ago with a clicking sound in the right knee. She now complains of constant aching pain in both knees right worse than left with no history of trauma. Pain unrelieved by acetaminophen.  Functional activities still remained fairly intact  History of fatigue and vision problems ankle leg edema constipation heat intolerance tingling in the legs but otherwise system review is normal  Past Medical History  Diagnosis Date  . Hypertension   . Heart murmur   . Diabetes mellitus without complication   . Hypothyroidism    Past Surgical History  Procedure Laterality Date  . Total knee arthroplasty Bilateral 2003  . Carpal tunnel release Right 2009  . Rotator cuff repair Right 2002  . Rotator cuff repair Left 2013  . Cataract extraction w/phaco Left 06/26/2014    Procedure: CATARACT EXTRACTION PHACO AND INTRAOCULAR LENS PLACEMENT (IOC);  Surgeon: Gemma Payor, MD;  Location: [facility];  Service: Ophthalmology;  Laterality: Left;  CDE:8.77  . Cataract extraction w/phaco Right 07/24/2014    Procedure: CATARACT EXTRACTION PHACO AND INTRAOCULAR LENS PLACEMENT RIGHT EYE;  Surgeon: Gemma Payor, MD;  Location: [facility];  Service: Ophthalmology;  Laterality: Right;  CDE 8.01   BP 164/83 mmHg  Ht  (1.575 m)  Wt 184 lb (83.462 kg)  BMI 33.65 kg/m2  She looks to be in good condition normal development grooming and hygiene. She is pleasant awake alert and oriented 3 and place with no assistive devices. 2 well-healed incisions no effusion no evidence of infection. Skin looks good. Knee  flexion 90  Right and 95 on the left both come to full extension appear to be stable in extension motor exam normal sensation intact good pulses bilaterally    data reviewed 2  Sets of x-rays 3 views both knees. 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 Both appear to have wear issues but no loosening.  I suspect she needs to have polyethylene revision if not total revision but I will defer this to a joint replacement specialist

## 2014-12-10 ENCOUNTER — Telehealth: Payer: Self-pay | Admitting: *Deleted

## 2014-12-10 ENCOUNTER — Other Ambulatory Visit: Payer: Self-pay | Admitting: *Deleted

## 2014-12-10 DIAGNOSIS — Z96653 Presence of artificial knee joint, bilateral: Secondary | ICD-10-CM

## 2014-12-10 NOTE — Telephone Encounter (Signed)
REFERRAL FAXED TO [facility] FOR DR Despina Hick OR Charlann Boxer

## 2015-01-07 NOTE — Telephone Encounter (Signed)
[facility], referral received, appt not scheduled yet as they are waiting for notes from another office

## 2015-06-04 LAB — LIPID PANEL
Cholesterol: 223 mg/dL — AB (ref 0–200)
HDL: 44 mg/dL (ref 35–70)
LDL Cholesterol: 67 mg/dL
TRIGLYCERIDES: 60 mg/dL (ref 40–160)

## 2015-06-04 LAB — HEMOGLOBIN A1C: Hemoglobin A1C: 6.5

## 2015-06-15 ENCOUNTER — Other Ambulatory Visit (HOSPITAL_COMMUNITY): Payer: Self-pay | Admitting: Family Medicine

## 2015-06-15 DIAGNOSIS — Z1231 Encounter for screening mammogram for malignant neoplasm of breast: Secondary | ICD-10-CM

## 2015-07-09 ENCOUNTER — Ambulatory Visit (HOSPITAL_COMMUNITY)
Admission: RE | Admit: 2015-07-09 | Discharge: 2015-07-09 | Disposition: A | Discharge status: Home / Self Care | Payer: Medicare Other | Source: Ambulatory Visit | Attending: Family Medicine | Admitting: Family Medicine

## 2015-07-09 DIAGNOSIS — Z1231 Encounter for screening mammogram for malignant neoplasm of breast: Secondary | ICD-10-CM | POA: Diagnosis not present

## 2015-07-09 LAB — HM MAMMOGRAPHY

## 2015-11-23 ENCOUNTER — Encounter (INDEPENDENT_AMBULATORY_CARE_PROVIDER_SITE_OTHER): Payer: Self-pay

## 2015-11-23 ENCOUNTER — Encounter: Payer: Self-pay | Admitting: Family Medicine

## 2015-11-23 ENCOUNTER — Ambulatory Visit (INDEPENDENT_AMBULATORY_CARE_PROVIDER_SITE_OTHER): Payer: Medicare Other | Admitting: Family Medicine

## 2015-11-23 VITALS — BP 122/60 | HR 64 | Resp 18 | Ht 62.5 in | Wt 174.1 lb

## 2015-11-23 DIAGNOSIS — Z7189 Other specified counseling: Secondary | ICD-10-CM

## 2015-11-23 DIAGNOSIS — E119 Type 2 diabetes mellitus without complications: Secondary | ICD-10-CM

## 2015-11-23 DIAGNOSIS — Z23 Encounter for immunization: Secondary | ICD-10-CM

## 2015-11-23 DIAGNOSIS — I10 Essential (primary) hypertension: Secondary | ICD-10-CM | POA: Diagnosis not present

## 2015-11-23 DIAGNOSIS — E785 Hyperlipidemia, unspecified: Secondary | ICD-10-CM

## 2015-11-23 DIAGNOSIS — M170 Bilateral primary osteoarthritis of knee: Secondary | ICD-10-CM

## 2015-11-23 DIAGNOSIS — Z7689 Persons encountering health services in other specified circumstances: Secondary | ICD-10-CM

## 2015-11-23 DIAGNOSIS — E039 Hypothyroidism, unspecified: Secondary | ICD-10-CM

## 2015-11-23 MED ORDER — SITAGLIPTIN PHOS-METFORMIN HCL 50-500 MG PO TABS: 1.0000 | ORAL_TABLET | Freq: Every day | ORAL | 1 refills | Status: DC

## 2015-11-23 NOTE — Progress Notes (Signed)
Chief Complaint  Patient presents with  . Establish Care    previous pcp Dr. Janna Archondiego   Patient is a pleasant 70 year old who is here for her first visit to establish. Old records are not available for review. The are requested today. She has well-controlled hypertension and is compliant with her medications. She has well-controlled diabetes. She states her fasting blood sugar this morning was 104. She denies any diabetic complications. She gets regular eye exams. No problems with her feet. Patient has hyperlipidemia and takes Lipitor. She also has hypothyroidism and is on thyroid replacement. She states she developed all these medical problems in her middle to late 2550s. She has had multiple orthopedic surgeries, both shoulders, and she has had both knees replaced. Carpal tunnel repair. She has had cataract surgery. She states her vision is good. She has a driver's license but does not drive. Socially, she is divorced. Lives alone. Has 5 adult children in good health. She has no complaints today. On review of systems she states she's had a sore throat off and on since July. We discussed that this may be her allergies. No other signs of illness. She has a bump on her arm that she would like to have removed. She is up-to-date with her immunizations. She has had a pneumonia shot. She had a shingles shot a couple years ago. Her tetanus is less than 10 years. She is due for flu shot today and this is given The mammograms. Her mother had breast cancer. Her mammograms have always been normal. She states her last bone density was normal in 2014. She states her colonoscopy was normal 6 years ago, and thinks it is due in 4 more years. Records were requested. She states she is good about her diabetes diet. She is not getting regular exercise because of bilateral knee pain. She lives independently.  Patient Active Problem List   Diagnosis Date Noted  . Essential hypertension 11/23/2015  . Diabetes  mellitus type 2, controlled, without complications (HCC) 11/23/2015  . Hyperlipidemia LDL goal <100 11/23/2015  . Primary osteoarthritis of both knees 11/23/2015  . Hypothyroidism (acquired) 11/23/2015   Current Outpatient Prescriptions on File Prior to Visit  Medication Sig Dispense Refill  . amLODipine-benazepril (LOTREL) 5-20 MG per capsule Take 1 capsule by mouth daily.     Marland Kitchen. aspirin 81 MG tablet Take 81 mg by mouth daily.    Marland Kitchen. atorvastatin (LIPITOR) 40 MG tablet Take 40 mg by mouth daily.    . Calcium Carbonate-Vitamin D (CALCIUM 500/D PO) Take 1 tablet by mouth daily.     . Cholecalciferol (VITAMIN D3) 2000 UNITS TABS Take 1 tablet by mouth daily.     Marland Kitchen. levothyroxine (SYNTHROID, LEVOTHROID) 88 MCG tablet Take 88 mcg by mouth daily before breakfast.    . triamterene-hydrochlorothiazide (DYAZIDE) 37.5-25 MG per capsule Take 1 capsule by mouth daily.      No current facility-administered medications on file prior to visit.    The patient has a family history of breast cancer in mother  Past Surgical History:  Procedure Laterality Date  . CARPAL TUNNEL RELEASE Right 2009  . CATARACT EXTRACTION W/PHACO Left 06/26/2014   Procedure: CATARACT EXTRACTION PHACO AND INTRAOCULAR LENS PLACEMENT (IOC);  Surgeon: Gemma PayorKerry Hunt, MD;  Location: [facility];  Service: Ophthalmology;  Laterality: Left;  CDE:8.77  . CATARACT EXTRACTION W/PHACO Right 07/24/2014   Procedure: CATARACT EXTRACTION PHACO AND INTRAOCULAR LENS PLACEMENT RIGHT EYE;  Surgeon: Gemma PayorKerry Hunt, MD;  Location: [facility];  Service: Ophthalmology;  Laterality: Right;  CDE 8.01  . JOINT REPLACEMENT    . ROTATOR CUFF REPAIR Right 2002  . ROTATOR CUFF REPAIR Left 2013  . TOTAL KNEE ARTHROPLASTY Bilateral 2003  . TUBAL LIGATION     Review of Systems  Constitutional: Negative for chills, fever and weight loss.  HENT: Positive for sore throat. Negative for congestion and hearing loss.        Sore throat off and on since July  Eyes: Negative for  blurred vision and pain.  Respiratory: Negative for cough and shortness of breath.   Cardiovascular: Negative for chest pain, palpitations and leg swelling.  Gastrointestinal: Positive for constipation. Negative for abdominal pain, diarrhea and heartburn.       Occas bothered by constipation  Genitourinary: Negative for dysuria and frequency.  Musculoskeletal: Positive for joint pain. Negative for falls and myalgias.       Knees  Skin:       Lesion R bicep  Neurological: Negative for dizziness, seizures and headaches.  Psychiatric/Behavioral: Negative for depression. The patient is not nervous/anxious and does not have insomnia.    Physical Exam  Constitutional: She is oriented to person, place, and time and well-developed, well-nourished, and in no distress.  HENT:  Head: Normocephalic and atraumatic.  Right Ear: External ear normal.  Left Ear: External ear normal.  Nose: Nose normal.  Mouth/Throat: Oropharynx is clear and moist. No oropharyngeal exudate.  Eyes: Pupils are equal, round, and reactive to light.  Neck: Normal range of motion. No JVD present. No thyromegaly present.  Cardiovascular: Normal rate and regular rhythm.   Murmur heard. Systolic murmur at RUSB  Pulmonary/Chest: Effort normal and breath sounds normal. She has no wheezes.  Abdominal: Soft. Bowel sounds are normal. There is no tenderness.  Musculoskeletal: Normal range of motion. She exhibits no edema.  Knees have well healed arthoplasty scars and good ROM, mild crepitus, no effusion  Lymphadenopathy:    She has no cervical adenopathy.  Neurological: She is alert and oriented to person, place, and time.  Antalgic gait with cane  Skin: Skin is warm and dry.  Right bicep at antecubital fossa with 1.5 cm inclusion cyst  Psychiatric: Mood, memory, affect and judgment normal.   1. Encounter to establish care with new doctor   2. Essential hypertension Well controlled on meds  3. Controlled type 2 diabetes  mellitus without complication, without long-term current use of insulin (HCC) History good control, records pending  4. Hyperlipidemia LDL goal <100   5. Primary osteoarthritis of both knees Hs been referred to orthopedics  6. Hypothyroidism (acquired)   7. Needs flu shot  - Flu Vaccine QUAD 36+ mos PF IM (Fluarix & Fluzone Quad PF)  Discharge Instructions    Flu Vaccine QUAD 36+ mos PF IM (Fluarix & Fluzone Quad PF)    Complete by:  As directed     Please see me in 2-3 months.  Call sooner if you have any questions or problems.  You can also reach [place] through My Chart for questions or prescription refills.  Let us know if you need our help.

## 2015-11-23 NOTE — Patient Instructions (Signed)
Need records from Dr Cecelia [place]arsiego and [place]  Continue current medicnes  Flu shot today  See me in 2-3 months  Please call sooner for any questions or for medicine refills

## 2016-01-04 ENCOUNTER — Ambulatory Visit (INDEPENDENT_AMBULATORY_CARE_PROVIDER_SITE_OTHER): Payer: Medicare Other | Admitting: Orthopedic Surgery

## 2016-01-04 ENCOUNTER — Encounter: Payer: Self-pay | Admitting: Orthopedic Surgery

## 2016-01-04 ENCOUNTER — Ambulatory Visit (INDEPENDENT_AMBULATORY_CARE_PROVIDER_SITE_OTHER): Payer: Medicare Other

## 2016-01-04 VITALS — BP 130/61 | HR 72 | Wt 177.0 lb

## 2016-01-04 DIAGNOSIS — M79642 Pain in left hand: Secondary | ICD-10-CM

## 2016-01-04 MED ORDER — GABAPENTIN 100 MG PO CAPS: 100.0000 mg | ORAL_CAPSULE | Freq: Every day | ORAL | 1 refills | Status: DC

## 2016-01-04 MED ORDER — GABAPENTIN 100 MG PO CAPS: 100.0000 mg | ORAL_CAPSULE | Freq: Every day | ORAL | 0 refills | Status: DC

## 2016-01-04 MED ORDER — B-6 100 MG PO TABS: 1.0000 | ORAL_TABLET | Freq: Two times a day (BID) | ORAL | 0 refills | Status: AC

## 2016-01-04 NOTE — Progress Notes (Signed)
Patient ID: Kathleen [name], female   DOB: 01/31/1946, 70 y.o.   MRN: [id number]  Chief Complaint  Patient presents with  . Hand Pain    LEFT HAND PAIN    HPI Kathleen [name] is a 70 y.o. female.  History of diabetes and thyroid disease as risk factors also had carpal tunnel syndrome with surgery required on the right presents with numbness and tingling left index long and ring finger despite taking Tylenol. It is associated with sharp stabbing aching radiating pain at night with numbness tingling pain and swelling  Review of systems recent weight loss constipation cold intolerant heat intolerant back pain swollen joints stiff joints muscle weakness joint pain numbness and tingling  Review of Systems Review of Systems   Past Medical History:  Diagnosis Date  . Arthritis   . Cataract   . Diabetes mellitus without complication (HCC)   . Heart murmur   . Hyperlipidemia   . Hypertension   . Hypothyroidism     Past Surgical History:  Procedure Laterality Date  . CARPAL TUNNEL RELEASE Right 2009  . CATARACT EXTRACTION W/PHACO Left 06/26/2014   Procedure: CATARACT EXTRACTION PHACO AND INTRAOCULAR LENS PLACEMENT (IOC);  Surgeon: Gemma Payor, MD;  Location: [facility];  Service: Ophthalmology;  Laterality: Left;  CDE:8.77  . CATARACT EXTRACTION W/PHACO Right 07/24/2014   Procedure: CATARACT EXTRACTION PHACO AND INTRAOCULAR LENS PLACEMENT RIGHT EYE;  Surgeon: Gemma Payor, MD;  Location: [facility];  Service: Ophthalmology;  Laterality: Right;  CDE 8.01  . JOINT REPLACEMENT    . ROTATOR CUFF REPAIR Right 2002  . ROTATOR CUFF REPAIR Left 2013  . TOTAL KNEE ARTHROPLASTY Bilateral 2003  . TUBAL LIGATION      Family History  Problem Relation Age of Onset  . Cancer Mother 22    breast  . Asthma Father   . Heart disease Maternal Uncle     several uncles with heart disease    Social History Social History  Substance Use Topics  . Smoking status: Never Smoker  . Smokeless tobacco: Never Used  .  Alcohol use No    No Known Allergies  Current Outpatient Prescriptions  Medication Sig Dispense Refill  . amLODipine-benazepril (LOTREL) 5-20 MG per capsule Take 1 capsule by mouth daily.     Marland Kitchen aspirin 81 MG tablet Take 81 mg by mouth daily.    Marland Kitchen atorvastatin (LIPITOR) 40 MG tablet Take 40 mg by mouth daily.    . Calcium Carbonate-Vitamin D (CALCIUM 500/D PO) Take 1 tablet by mouth daily.     . Cholecalciferol (VITAMIN D3) 2000 UNITS TABS Take 1 tablet by mouth daily.     Marland Kitchen labetalol (NORMODYNE) 200 MG tablet Take 200 mg by mouth daily.    Marland Kitchen levothyroxine (SYNTHROID, LEVOTHROID) 88 MCG tablet Take 88 mcg by mouth daily before breakfast.    . sitaGLIPtin-metformin (JANUMET) 50-500 MG tablet Take 1 tablet by mouth daily. 90 tablet 1  . triamterene-hydrochlorothiazide (DYAZIDE) 37.5-25 MG per capsule Take 1 capsule by mouth daily.     Marland Kitchen ACCU-CHEK AVIVA PLUS test strip      No current facility-administered medications for this visit.      Physical Exam Blood pressure 130/61, pulse 72, weight 177 lb (80.3 kg). Physical Exam The patient is well developed well nourished and well groomed.  Orientation to person place and time is normal  Mood is pleasant.  Ambulatory status Cane required  Left Upper extremity examination reveals the following:  Inspection reveals no  swelling. There is tenderness over the carpal tunnel.  Range of motion of the wrist and elbow are normal  Motor exam shows mild weakness with grip strength.  Wrist joint is stable  Provocative tests for carpal tunnel Phalen's test  positive Carpal tunnel compression test positive  Pulses are normal in the radial and ulnar artery with a normal Allen's test.  Decreased sensation is noted in the median nerve distribution. Soft touch is normal.  Opposite extremity  Data Reviewed  independent image interpretation :  X-ray show [facility] arthritis  Assessment    Left CTS    Plan     Wrist splint Gabapentin   Vit B 6  F/U 6 weeks

## 2016-02-02 ENCOUNTER — Encounter: Payer: Self-pay | Admitting: Family Medicine

## 2016-02-02 ENCOUNTER — Ambulatory Visit (INDEPENDENT_AMBULATORY_CARE_PROVIDER_SITE_OTHER): Payer: Medicare Other | Admitting: Family Medicine

## 2016-02-02 VITALS — BP 118/66 | HR 64 | Temp 99.1°F | Resp 16 | Ht 62.5 in | Wt 180.0 lb

## 2016-02-02 DIAGNOSIS — E785 Hyperlipidemia, unspecified: Secondary | ICD-10-CM | POA: Diagnosis not present

## 2016-02-02 DIAGNOSIS — I10 Essential (primary) hypertension: Secondary | ICD-10-CM | POA: Diagnosis not present

## 2016-02-02 DIAGNOSIS — E119 Type 2 diabetes mellitus without complications: Secondary | ICD-10-CM

## 2016-02-02 DIAGNOSIS — E039 Hypothyroidism, unspecified: Secondary | ICD-10-CM | POA: Diagnosis not present

## 2016-02-02 DIAGNOSIS — I517 Cardiomegaly: Secondary | ICD-10-CM | POA: Insufficient documentation

## 2016-02-02 DIAGNOSIS — G5602 Carpal tunnel syndrome, left upper limb: Secondary | ICD-10-CM

## 2016-02-02 DIAGNOSIS — I358 Other nonrheumatic aortic valve disorders: Secondary | ICD-10-CM | POA: Insufficient documentation

## 2016-02-02 DIAGNOSIS — E663 Overweight: Secondary | ICD-10-CM | POA: Insufficient documentation

## 2016-02-02 LAB — CBC
HCT: 38.7 % (ref 35.0–45.0)
HEMOGLOBIN: 12.6 g/dL (ref 11.7–15.5)
MCH: 29.7 pg (ref 27.0–33.0)
MCHC: 32.6 g/dL (ref 32.0–36.0)
MCV: 91.3 fL (ref 80.0–100.0)
MPV: 8.9 fL (ref 7.5–12.5)
Platelets: 451 10*3/uL — ABNORMAL HIGH (ref 140–400)
RBC: 4.24 MIL/uL (ref 3.80–5.10)
RDW: 15.3 % — ABNORMAL HIGH (ref 11.0–15.0)
WBC: 7.5 10*3/uL (ref 3.8–10.8)

## 2016-02-02 LAB — COMPREHENSIVE METABOLIC PANEL
ALBUMIN: 4.6 g/dL (ref 3.6–5.1)
ALT: 33 U/L — ABNORMAL HIGH (ref 6–29)
AST: 35 U/L (ref 10–35)
Alkaline Phosphatase: 92 U/L (ref 33–130)
BILIRUBIN TOTAL: 0.5 mg/dL (ref 0.2–1.2)
BUN: 18 mg/dL (ref 7–25)
CO2: 28 mmol/L (ref 20–31)
CREATININE: 0.94 mg/dL — AB (ref 0.60–0.93)
Calcium: 10.7 mg/dL — ABNORMAL HIGH (ref 8.6–10.4)
Chloride: 102 mmol/L (ref 98–110)
Glucose, Bld: 102 mg/dL — ABNORMAL HIGH (ref 65–99)
Potassium: 4.4 mmol/L (ref 3.5–5.3)
SODIUM: 138 mmol/L (ref 135–146)
Total Protein: 7.7 g/dL (ref 6.1–8.1)

## 2016-02-02 LAB — LIPID PANEL
CHOL/HDL RATIO: 2.6 ratio (ref <5.0)
Cholesterol: 125 mg/dL (ref <200)
HDL: 49 mg/dL — ABNORMAL LOW (ref >50)
LDL Cholesterol: 66 mg/dL
Triglycerides: 52 mg/dL (ref <150)
VLDL: 10 mg/dL (ref <30)

## 2016-02-02 LAB — TSH: TSH: 3.15 m[IU]/L

## 2016-02-02 NOTE — Progress Notes (Signed)
Chief Complaint  Patient presents with  . Follow-up    2 month   Routine follow up Feels well Biggest problem is hand pain L.  Had carpal tunnel repair on the R and now the L hand is symptomatic.  She has seen orthopedic and had x rays that show diffuse osteoarthritis.  Also is taking gabapentin and B6 vitamin with night brace.  Still very symptomatic - rubbing hand with papin.  Advised to take arthritis tylenol. diabetes well managed.  Fasting sugar this am 106 BP is good Lipids well managed in past, due for lab test today.   Patient Active Problem List   Diagnosis Date Noted  . Overweight 02/02/2016  . Essential hypertension 11/23/2015  . Diabetes mellitus type 2, controlled, without complications (HCC) 11/23/2015  . Hyperlipidemia LDL goal <100 11/23/2015  . Primary osteoarthritis of both knees 11/23/2015  . Hypothyroidism (acquired) 11/23/2015    Outpatient Encounter Prescriptions as of 02/02/2016  Medication Sig  . ACCU-CHEK AVIVA PLUS test strip   . amLODipine-benazepril (LOTREL) 5-20 MG per capsule Take 1 capsule by mouth daily.   Marland Kitchen. aspirin 81 MG tablet Take 81 mg by mouth daily.  Marland Kitchen. atorvastatin (LIPITOR) 40 MG tablet Take 40 mg by mouth daily.  . Calcium Carbonate-Vitamin D (CALCIUM 500/D PO) Take 1 tablet by mouth daily.   . Cholecalciferol (VITAMIN D3) 2000 UNITS TABS Take 1 tablet by mouth daily.   Marland Kitchen. gabapentin (NEURONTIN) 100 MG capsule Take 1 capsule (100 mg total) by mouth at bedtime.  Marland Kitchen. labetalol (NORMODYNE) 200 MG tablet Take 200 mg by mouth daily.  Marland Kitchen. levothyroxine (SYNTHROID, LEVOTHROID) 88 MCG tablet Take 88 mcg by mouth daily before breakfast.  . Pyridoxine HCl (B-6) 100 MG TABS Take 1 tablet by mouth 2 (two) times daily.  . sitaGLIPtin-metformin (JANUMET) 50-500 MG tablet Take 1 tablet by mouth daily.  Marland Kitchen. triamterene-hydrochlorothiazide (DYAZIDE) 37.5-25 MG per capsule Take 1 capsule by mouth daily.    No facility-administered encounter medications on  file as of 02/02/2016.    No Known Allergies  Review of Systems  Constitutional: Negative for activity change, appetite change and fatigue.  HENT: Negative for congestion and dental problem.   Eyes: Negative for photophobia and visual disturbance.  Respiratory: Negative for cough and shortness of breath.   Cardiovascular: Negative for chest pain, palpitations and leg swelling.  Gastrointestinal: Negative for blood in stool, constipation and diarrhea.  Genitourinary: Negative for difficulty urinating and frequency.  Musculoskeletal: Positive for arthralgias, gait problem and joint swelling.  Neurological: Positive for numbness.       Pain and numbness L  hand  Psychiatric/Behavioral: Negative for dysphoric mood. The patient is not nervous/anxious.     BP 118/66 (BP Location: Right Arm, Patient Position: Sitting, Cuff Size: Normal)   Pulse 64   Temp 99.1 F (37.3 C) (Oral)   Resp 16   Ht 5' 2.5" (1.588 m)   Wt 180 lb 0.6 oz (81.7 kg)   SpO2 97%   BMI 32.40 kg/m   Physical Exam  Constitutional: She is oriented to person, place, and time. She appears well-developed and well-nourished. No distress.  Antalgic gait  HENT:  Head: Normocephalic and atraumatic.  Mouth/Throat: Oropharynx is clear and moist.  Eyes: Conjunctivae are normal. Pupils are equal, round, and reactive to light.  Neck: Normal range of motion. Neck supple. No thyromegaly present.  Cardiovascular: Normal rate and regular rhythm.   Murmur heard. Pulmonary/Chest: Effort normal and breath sounds normal. No  respiratory distress.  Abdominal: Soft. Bowel sounds are normal. There is no tenderness.  Musculoskeletal: Normal range of motion. She exhibits no edema.  Both knees with well healed arthoplasty scars and crepitus  Lymphadenopathy:    She has no cervical adenopathy.  Neurological: She is alert and oriented to person, place, and time.  Psychiatric: She has a normal mood and affect. Her behavior is normal. Thought  content normal.    ASSESSMENT/PLAN:  1. Essential hypertension - UA  2. Hypothyroidism (acquired) - TSH  3. Controlled type 2 diabetes mellitus without complication, without long-term current use of insulin (HCC) - CBC - Comprehensive metabolic panel - Hemoglobin A1c  4. Hyperlipidemia LDL goal <100 - Lipid panel  5. Carpal tunnel syndrome of left wrist  6. Overweight   Patient Instructions  Need blood tests today I will send you a letter with your test results.  If there is anything of concern, we will call right away.  See me in six months for a physical We will call you about a medicare wellness evaluation  Call for medication refills or problems  Please ask your eye doctor to get me a copy of your yearly eye exam   Eustace MooreYvonne Sue Alta Shober, MD

## 2016-02-02 NOTE — Patient Instructions (Addendum)
Need blood tests today I will send you a letter with your test results.  If there is anything of concern, we will call right away.  See me in six months for a physical We will call you about a medicare wellness evaluation  Call for medication refills or problems  Please ask your eye doctor to get me a copy of your yearly eye exam

## 2016-02-03 ENCOUNTER — Encounter: Payer: Self-pay | Admitting: Family Medicine

## 2016-02-03 LAB — URINALYSIS, ROUTINE W REFLEX MICROSCOPIC
BILIRUBIN URINE: NEGATIVE
Glucose, UA: NEGATIVE
HGB URINE DIPSTICK: NEGATIVE
KETONES UR: NEGATIVE
Leukocytes, UA: NEGATIVE
Nitrite: NEGATIVE
PROTEIN: NEGATIVE
Specific Gravity, Urine: 1.017 (ref 1.001–1.035)
pH: 5.5 (ref 5.0–8.0)

## 2016-02-03 LAB — HEMOGLOBIN A1C
Hgb A1c MFr Bld: 5.3 % (ref <5.7)
MEAN PLASMA GLUCOSE: 105 mg/dL

## 2016-02-03 LAB — VITAMIN D 25 HYDROXY (VIT D DEFICIENCY, FRACTURES): VIT D 25 HYDROXY: 85 ng/mL (ref 30–100)

## 2016-02-15 ENCOUNTER — Ambulatory Visit: Payer: Medicare Other | Admitting: Orthopedic Surgery

## 2016-02-22 ENCOUNTER — Encounter: Payer: Self-pay | Admitting: Orthopedic Surgery

## 2016-02-22 ENCOUNTER — Ambulatory Visit (INDEPENDENT_AMBULATORY_CARE_PROVIDER_SITE_OTHER): Payer: Medicare Other | Admitting: Orthopedic Surgery

## 2016-02-22 DIAGNOSIS — G5602 Carpal tunnel syndrome, left upper limb: Secondary | ICD-10-CM | POA: Diagnosis not present

## 2016-02-22 NOTE — Addendum Note (Signed)
Addended by: Adella HareBOOTHE, Mirissa Lopresti B on: 02/22/2016 10:50 AM   Modules accepted: Orders, SmartSet

## 2016-02-22 NOTE — Progress Notes (Signed)
Patient ID: Kathleen [name], female   DOB: 01/26/1946, 70 y.o.   MRN: [id number]  Chief Complaint  Patient presents with  . Follow-up    left carpal tunnel    HPI Kathleen [name] is a 70 y.o. female.   HPI  70 year old female treated for carpal tunnel on the left status post right carpal tunnel many years ago. Complains of pain paresthesias thumb index long and ring finger which is filled operative treatment including splinting gabapentin and vitamin B 6  She would like to proceed with carpal tunnel release  Review of Systems Review of Systems  Denies fever chills weight loss  Physical Exam There were no vitals taken for this visit.   Physical Exam  Constitutional: She is oriented to person, place, and time. She appears well-developed and well-nourished. No distress.  Cardiovascular: Normal rate and intact distal pulses.   Neurological: She is alert and oriented to person, place, and time. She has normal reflexes. A sensory deficit is present. She exhibits normal muscle tone. Coordination normal.  Skin: Skin is warm and dry. No rash noted. She is not diaphoretic. No erythema. No pallor.  Psychiatric: She has a normal mood and affect. Her behavior is normal. Judgment and thought content normal.    Left upper extremity has a [facility] she has painful palpation over the carpal tunnel decreased sensation in the thumb index long part of the ring finger no strength deficit she does have trouble with rotation of the forearm skin is otherwise intact pulses are normal no lymphadenopathy present  Encounter Diagnosis  Name Primary?  . Left carpal tunnel syndrome Yes    This procedure has been fully reviewed with the patient and written informed consent has been obtained.  Patient of left carpal tunnel release appropriate informed consent process has been completed

## 2016-02-22 NOTE — Patient Instructions (Addendum)
Surgery 12/21  Carpal Tunnel Syndrome Carpal tunnel syndrome is a condition that causes pain in your hand and arm. The carpal tunnel is a narrow area located on the palm side of your wrist. Repeated wrist motion or certain diseases may cause swelling within the tunnel. This swelling pinches the main nerve in the wrist (median nerve). What are the causes? This condition may be caused by:  Repeated wrist motions.  Wrist injuries.  Arthritis.  A cyst or tumor in the carpal tunnel.  Fluid buildup during pregnancy. Sometimes the cause of this condition is not known. What increases the risk? This condition is more likely to develop in:  People who have jobs that cause them to repeatedly move their wrists in the same motion, such as Health visitorbutchers and cashiers.  Women.  People with certain conditions, such as:  Diabetes.  Obesity.  An underactive thyroid (hypothyroidism).  Kidney failure. What are the signs or symptoms? Symptoms of this condition include:  A tingling feeling in your fingers, especially in your thumb, index, and middle fingers.  Tingling or numbness in your hand.  An aching feeling in your entire arm, especially when your wrist and elbow are bent for long periods of time.  Wrist pain that goes up your arm to your shoulder.  Pain that goes down into your palm or fingers.  A weak feeling in your hands. You may have trouble grabbing and holding items. Your symptoms may feel worse during the night. How is this diagnosed? This condition is diagnosed with a medical history and physical exam. You may also have tests, including:  An electromyogram (EMG). This test measures electrical signals sent by your nerves into the muscles.  X-rays. How is this treated? Treatment for this condition includes:  Lifestyle changes. It is important to stop doing or modify the activity that caused your condition.  Physical or occupational therapy.  Medicines for pain and  inflammation. This may include medicine that is injected into your wrist.  A wrist splint.  Surgery. Follow these instructions at home: If you have a splint:  Wear it as told by your health care provider. Remove it only as told by your health care provider.  Loosen the splint if your fingers become numb and tingle, or if they turn cold and blue.  Keep the splint clean and dry. General instructions  Take over-the-counter and prescription medicines only as told by your health care provider.  Rest your wrist from any activity that may be causing your pain. If your condition is work related, talk to your employer about changes that can be made, such as getting a wrist pad to use while typing.  If directed, apply ice to the painful area:  Put ice in a plastic bag.  Place a towel between your skin and the bag.  Leave the ice on for 20 minutes, 2-3 times per day.  Keep all follow-up visits as told by your health care provider. This is important.  Do any exercises as told by your health care provider, physical therapist, or occupational therapist. Contact a health care provider if:  You have new symptoms.  Your pain is not controlled with medicines.  Your symptoms get worse. This information is not intended to replace advice given to you by your health care provider. Make sure you discuss any questions you have with your health care provider. Document Released: 03/11/2000 Document Revised: 07/23/2015 Document Reviewed: 07/30/2014 Elsevier Interactive Patient Education  2017 [employer]

## 2016-02-29 ENCOUNTER — Encounter: Payer: Self-pay | Admitting: *Deleted

## 2016-02-29 NOTE — Progress Notes (Signed)
Surgical pre authorization not required for CPT 64721  Reference John B. 02/29/16 9:42am

## 2016-03-11 NOTE — Patient Instructions (Signed)
Kathleen [name]  03/11/2016     @PREFPERIOPPHARMACY @   Your procedure is scheduled on  03/17/2016   [facility] at  725  A.M.  Call this number if you have problems the morning of surgery:  [phone]   Remember:  Do not eat food or drink liquids after midnight.  Take these medicines the morning of surgery with A SIP OF WATER  Lotrel, labetolol, levothyroxine.   Do not wear jewelry, make-up or nail polish.  Do not wear lotions, powders, or perfumes, or deoderant.  Do not shave 48 hours prior to surgery.  Men may shave face and neck.  Do not bring valuables to the hospital.  [facility] not responsible for any belongings or valuables.  Contacts, dentures or bridgework may not be worn into surgery.  Leave your suitcase in the car.  After surgery it may be brought to your room.  For patients admitted to the hospital, discharge time will be determined by your treatment team.  Patients discharged the day of surgery will not be allowed to drive home.   Name and phone number of your driver:   family Special instructions:  none  Please read over the following fact sheets that you were given. Anesthesia Post-op Instructions and Care and Recovery After Surgery       Carpal Tunnel Release Carpal tunnel release is a surgical procedure to relieve numbness and pain in your hand that are caused by carpal tunnel syndrome. Your carpal tunnel is a narrow, hollow space in your wrist. It passes between your wrist bones and a band of connective tissue (transverse carpal ligament). The nerve that supplies most of your hand (median nerve) passes through this space, and so do the connections between your fingers and the muscles of your arm (tendons). Carpal tunnel syndrome makes this space swell and become narrow, and this causes pain and numbness. In carpal tunnel release surgery, a surgeon cuts through the transverse carpal ligament to make more room in the carpal tunnel  space. You may have this surgery if other types of treatment have not worked. Tell a health care provider about:  Any allergies you have.  All medicines you are taking, including vitamins, herbs, eye drops, creams, and over-the-counter medicines.  Any problems you or family members have had with anesthetic medicines.  Any blood disorders you have.  Any surgeries you have had.  Any medical conditions you have. What are the risks? Generally, this is a safe procedure. However, problems may occur, including:  Bleeding.  Infection.  Injury to the median nerve.  Need for additional surgery. What happens before the procedure?  Ask your health care provider about:  Changing or stopping your regular medicines. This is especially important if you are taking diabetes medicines or blood thinners.  Taking medicines such as aspirin and ibuprofen. These medicines can thin your blood. Do not take these medicines before your procedure if your health care provider instructs you not to.  Do not eat or drink anything after midnight on the night before the procedure or as directed by your health care provider.  Plan to have someone take you home after the procedure. What happens during the procedure?  An IV tube may be inserted into a vein.  You will be given one of the following:  A medicine that numbs the wrist area (local anesthetic). You may also be given a medicine to make you relax (sedative).  A medicine that makes you go to sleep (general anesthetic).  Your arm, hand, and wrist will be cleaned with a germ-killing solution (antiseptic).  Your surgeon will make a surgical cut (incision) over the palm side of your wrist. The surgeon will pull aside the skin of your wrist to expose the carpal tunnel space.  The surgeon will cut the transverse carpal ligament.  The edges of the incision will be closed with stitches (sutures) or staples.  A bandage (dressing) will be placed over  your wrist and wrapped around your hand and wrist. What happens after the procedure?  You may spend some time in a recovery area.  Your blood pressure, heart rate, breathing rate, and blood oxygen level will be monitored often until the medicines you were given have worn off.  You will likely have some pain. You will be given pain medicine.  You may need to wear a splint or a wrist brace over your dressing. This information is not intended to replace advice given to you by your health care provider. Make sure you discuss any questions you have with your health care provider. Document Released: 06/04/2003 Document Revised: 08/20/2015 Document Reviewed: 10/30/2013 Elsevier Interactive Patient Education  2017 [employer] Refer to this sheet in the next few weeks. These instructions provide you with information about caring for yourself after your procedure. Your health care provider may also give you more specific instructions. Your treatment has been planned according to current medical practices, but problems sometimes occur. Call your health care provider if you have any problems or questions after your procedure. What can I expect after the procedure? After your procedure, it is typical to have the following:  Pain.  Numbness.  Tingling.  Swelling.  Stiffness.  Bruising. Follow these instructions at home:  Take medicines only as directed by your health care provider.  There are many different ways to close and cover an incision, including stitches (sutures), skin glue, and adhesive strips. Follow your health care provider's instructions about:  Incision care.  Bandage (dressing) changes and removal.  Incision closure removal.  Wear a splint or a brace as directed by your surgeon. You may need to do this for 2-3 weeks.  Keep your hand raised (elevated) above the level of your heart while you are resting. Move your fingers often.  Avoid  activities that cause hand pain.  Ask your surgeon when you can start to do all of your usual activities again, such as:  Driving.  Returning to work.  Bathing and swimming.  Keep all follow-up visits as directed by your health care provider. This is important. You may need physical therapy for several months to speed healing and regain movement. Contact a health care provider if:  You have drainage, redness, swelling, or pain at your incision site.  You have a fever.  You have chills.  Your pain medicine is not working.  Your symptoms do not go away after 2 months.  Your symptoms go away and then return. Get help right away if:  You have pain or numbness that is getting worse.  Your fingers change color.  You are not able to move your fingers. This information is not intended to replace advice given to you by your health care provider. Make sure you discuss any questions you have with your health care provider. Document Released: 10/01/2004 Document Revised: 08/20/2015 Document Reviewed: 10/30/2013 Elsevier Interactive Patient Education  2017 [place] INSTRUCTIONS POST-ANESTHESIA  IMMEDIATELY FOLLOWING SURGERY:  Do not drive or operate machinery for the first twenty four hours after surgery.  Do not make any important decisions for twenty four hours after surgery or while taking narcotic pain medications or sedatives.  If you develop intractable nausea and vomiting or a severe headache please notify your doctor immediately.  FOLLOW-UP:  Please make an appointment with your surgeon as instructed. You do not need to follow up with anesthesia unless specifically instructed to do so.  WOUND CARE INSTRUCTIONS (if applicable):  Keep a dry clean dressing on the anesthesia/puncture wound site if there is drainage.  Once the wound has quit draining you may leave it open to air.  Generally you should leave the bandage intact for twenty four hours unless there is  drainage.  If the epidural site drains for more than 36-48 hours please call the anesthesia department.  QUESTIONS?:  Please feel free to call your physician or the hospital operator if you have any questions, and they will be happy to assist you.

## 2016-03-14 ENCOUNTER — Other Ambulatory Visit: Payer: Self-pay

## 2016-03-14 ENCOUNTER — Encounter (HOSPITAL_COMMUNITY): Payer: Self-pay

## 2016-03-14 ENCOUNTER — Encounter (HOSPITAL_COMMUNITY)
Admission: RE | Admit: 2016-03-14 | Discharge: 2016-03-14 | Disposition: A | Discharge status: Home / Self Care | Payer: Medicare Other | Source: Ambulatory Visit | Attending: Orthopedic Surgery | Admitting: Orthopedic Surgery

## 2016-03-14 DIAGNOSIS — Z01818 Encounter for other preprocedural examination: Secondary | ICD-10-CM

## 2016-03-14 DIAGNOSIS — I10 Essential (primary) hypertension: Secondary | ICD-10-CM | POA: Diagnosis not present

## 2016-03-14 DIAGNOSIS — G5602 Carpal tunnel syndrome, left upper limb: Secondary | ICD-10-CM | POA: Insufficient documentation

## 2016-03-14 DIAGNOSIS — Z7984 Long term (current) use of oral hypoglycemic drugs: Secondary | ICD-10-CM | POA: Diagnosis not present

## 2016-03-14 DIAGNOSIS — E119 Type 2 diabetes mellitus without complications: Secondary | ICD-10-CM | POA: Diagnosis not present

## 2016-03-14 DIAGNOSIS — Z79899 Other long term (current) drug therapy: Secondary | ICD-10-CM | POA: Diagnosis not present

## 2016-03-16 NOTE — H&P (Signed)
  Chief Complaint  Patient presents with  . Follow-up      left carpal tunnel      HPI Kathleen [name] is a 70 y.o. female.   HPI   70 year old female treated for carpal tunnel on the left status post right carpal tunnel many years ago. Complains of pain paresthesias thumb index long and ring finger which is filled operative treatment including splinting gabapentin and vitamin B 6   She would like to proceed with carpal tunnel release   Review of Systems Review of Systems   Denies fever chills weight loss, Nausea vomiting diarrhea chest pain numbness tingling. All other systems were reviewed and were negative   Physical Exam      Physical Exam  Constitutional: She is oriented to person, place, and time. She appears well-developed and well-nourished. No distress.  Cardiovascular: Normal rate and intact distal pulses.   Neurological: She is alert and oriented to person, place, and time. She has normal reflexes. A sensory deficit is present. She exhibits normal muscle tone. Coordination normal.  Skin: Skin is warm and dry. No rash noted. She is not diaphoretic. No erythema. No pallor.  Psychiatric: She has a normal mood and affect. Her behavior is normal. Judgment and thought content normal.      Left upper extremity has a [facility] she has painful palpation over the carpal tunnel decreased sensation in the thumb index long part of the ring finger no strength deficit she does have trouble with rotation of the forearm skin is otherwise intact pulses are normal no lymphadenopathy present       Encounter Diagnosis  Name Primary?  . Left carpal tunnel syndrome Yes      This procedure has been fully reviewed with the patient and written informed consent has been obtained.    left carpal tunnel release appropriate informed consent process has been completed

## 2016-03-17 ENCOUNTER — Ambulatory Visit (HOSPITAL_COMMUNITY)
Admission: RE | Admit: 2016-03-17 | Discharge: 2016-03-17 | Disposition: A | Discharge status: Home / Self Care | Payer: Medicare Other | Source: Ambulatory Visit | Attending: Orthopedic Surgery | Admitting: Orthopedic Surgery

## 2016-03-17 ENCOUNTER — Encounter (HOSPITAL_COMMUNITY): Payer: Self-pay | Admitting: Anesthesiology

## 2016-03-17 ENCOUNTER — Ambulatory Visit (HOSPITAL_COMMUNITY): Payer: Medicare Other | Admitting: Anesthesiology

## 2016-03-17 ENCOUNTER — Encounter (HOSPITAL_COMMUNITY)
Admission: RE | Disposition: A | Discharge status: Home / Self Care | Payer: Self-pay | Source: Ambulatory Visit | Attending: Orthopedic Surgery

## 2016-03-17 DIAGNOSIS — I10 Essential (primary) hypertension: Secondary | ICD-10-CM | POA: Diagnosis not present

## 2016-03-17 DIAGNOSIS — E119 Type 2 diabetes mellitus without complications: Secondary | ICD-10-CM | POA: Diagnosis not present

## 2016-03-17 DIAGNOSIS — Z7984 Long term (current) use of oral hypoglycemic drugs: Secondary | ICD-10-CM | POA: Diagnosis not present

## 2016-03-17 DIAGNOSIS — Z79899 Other long term (current) drug therapy: Secondary | ICD-10-CM | POA: Insufficient documentation

## 2016-03-17 DIAGNOSIS — G5602 Carpal tunnel syndrome, left upper limb: Secondary | ICD-10-CM | POA: Insufficient documentation

## 2016-03-17 HISTORY — DX: Carpal tunnel syndrome, left upper limb: G56.02

## 2016-03-17 HISTORY — PX: CARPAL TUNNEL RELEASE: SHX101

## 2016-03-17 LAB — GLUCOSE, CAPILLARY
GLUCOSE-CAPILLARY: 107 mg/dL — AB (ref 65–99)
GLUCOSE-CAPILLARY: 112 mg/dL — AB (ref 65–99)

## 2016-03-17 SURGERY — CARPAL TUNNEL RELEASE
Anesthesia: Regional | Site: Hand | Laterality: Left

## 2016-03-17 MED ORDER — CEFAZOLIN SODIUM-DEXTROSE 2-4 GM/100ML-% IV SOLN
2.0000 g | INTRAVENOUS | Status: AC
  Administered 2016-03-17: 2 g via INTRAVENOUS

## 2016-03-17 MED ORDER — PROPOFOL 500 MG/50ML IV EMUL
INTRAVENOUS | Status: DC | PRN
  Administered 2016-03-17: 50 ug/kg/min via INTRAVENOUS

## 2016-03-17 MED ORDER — LACTATED RINGERS IV SOLN
INTRAVENOUS | Status: DC
  Administered 2016-03-17: 08:00:00 via INTRAVENOUS

## 2016-03-17 MED ORDER — BUPIVACAINE HCL (PF) 0.5 % IJ SOLN
INTRAMUSCULAR | Status: AC
  Filled 2016-03-17: qty 30

## 2016-03-17 MED ORDER — PROPOFOL 10 MG/ML IV BOLUS
INTRAVENOUS | Status: AC
  Filled 2016-03-17: qty 20

## 2016-03-17 MED ORDER — CHLORHEXIDINE GLUCONATE 4 % EX LIQD: 60.0000 mL | Freq: Once | CUTANEOUS | Status: DC

## 2016-03-17 MED ORDER — FENTANYL CITRATE (PF) 100 MCG/2ML IJ SOLN
25.0000 ug | INTRAMUSCULAR | Status: DC | PRN
  Administered 2016-03-17: 25 ug via INTRAVENOUS

## 2016-03-17 MED ORDER — LIDOCAINE HCL (PF) 0.5 % IJ SOLN
INTRAMUSCULAR | Status: DC | PRN
  Administered 2016-03-17: 50 mL via INTRAVENOUS

## 2016-03-17 MED ORDER — FENTANYL CITRATE (PF) 100 MCG/2ML IJ SOLN
INTRAMUSCULAR | Status: AC
  Filled 2016-03-17: qty 2

## 2016-03-17 MED ORDER — MIDAZOLAM HCL 2 MG/2ML IJ SOLN
INTRAMUSCULAR | Status: AC
  Filled 2016-03-17: qty 2

## 2016-03-17 MED ORDER — FENTANYL CITRATE (PF) 100 MCG/2ML IJ SOLN: 25.0000 ug | INTRAMUSCULAR | Status: DC | PRN

## 2016-03-17 MED ORDER — MIDAZOLAM HCL 2 MG/2ML IJ SOLN
1.0000 mg | INTRAMUSCULAR | Status: DC | PRN
  Administered 2016-03-17 (×2): 1 mg via INTRAVENOUS

## 2016-03-17 MED ORDER — BUPIVACAINE HCL (PF) 0.5 % IJ SOLN
INTRAMUSCULAR | Status: DC | PRN
  Administered 2016-03-17: 20 mL

## 2016-03-17 MED ORDER — FENTANYL CITRATE (PF) 100 MCG/2ML IJ SOLN
INTRAMUSCULAR | Status: DC | PRN
  Administered 2016-03-17: 25 ug via INTRAVENOUS

## 2016-03-17 MED ORDER — HYDROCODONE-ACETAMINOPHEN 5-325 MG PO TABS: 1.0000 | ORAL_TABLET | ORAL | 0 refills | Status: DC | PRN

## 2016-03-17 MED ORDER — CEFAZOLIN SODIUM-DEXTROSE 2-4 GM/100ML-% IV SOLN
INTRAVENOUS | Status: AC
  Filled 2016-03-17: qty 100

## 2016-03-17 MED ORDER — 0.9 % SODIUM CHLORIDE (POUR BTL) OPTIME
TOPICAL | Status: DC | PRN
  Administered 2016-03-17: 1000 mL

## 2016-03-17 MED ORDER — PROPOFOL 10 MG/ML IV BOLUS
INTRAVENOUS | Status: DC | PRN
  Administered 2016-03-17: 10 mg via INTRAVENOUS

## 2016-03-17 SURGICAL SUPPLY — 38 items
BAG HAMPER (MISCELLANEOUS) ×3 IMPLANT
BANDAGE ELASTIC 3 LF NS (GAUZE/BANDAGES/DRESSINGS) ×3 IMPLANT
BANDAGE ESMARK 4X12 BL STRL LF (DISPOSABLE) ×1 IMPLANT
BLADE SURG 15 STRL LF DISP TIS (BLADE) ×1 IMPLANT
BLADE SURG 15 STRL SS (BLADE) ×2
BNDG COHESIVE 4X5 TAN STRL (GAUZE/BANDAGES/DRESSINGS) ×3 IMPLANT
BNDG ESMARK 4X12 BLUE STRL LF (DISPOSABLE) ×3
BNDG GAUZE ELAST 4 BULKY (GAUZE/BANDAGES/DRESSINGS) ×3 IMPLANT
CHLORAPREP W/TINT 26ML (MISCELLANEOUS) ×3 IMPLANT
CLOTH BEACON ORANGE TIMEOUT ST (SAFETY) ×3 IMPLANT
COVER LIGHT HANDLE STERIS (MISCELLANEOUS) ×6 IMPLANT
CUFF TOURNIQUET SINGLE 18IN (TOURNIQUET CUFF) ×3 IMPLANT
DECANTER SPIKE VIAL GLASS SM (MISCELLANEOUS) ×3 IMPLANT
DRAPE PROXIMA HALF (DRAPES) ×3 IMPLANT
DRSG XEROFORM 1X8 (GAUZE/BANDAGES/DRESSINGS) ×3 IMPLANT
ELECT NEEDLE TIP 2.8 STRL (NEEDLE) ×3 IMPLANT
ELECT REM PT RETURN 9FT ADLT (ELECTROSURGICAL) ×3
ELECTRODE REM PT RTRN 9FT ADLT (ELECTROSURGICAL) ×1 IMPLANT
GAUZE SPONGE 4X4 12PLY STRL (GAUZE/BANDAGES/DRESSINGS) ×3 IMPLANT
GLOVE BIOGEL PI IND STRL 7.0 (GLOVE) ×2 IMPLANT
GLOVE BIOGEL PI INDICATOR 7.0 (GLOVE) ×4
GLOVE EXAM NITRILE MD LF STRL (GLOVE) ×3 IMPLANT
GLOVE SKINSENSE NS SZ8.0 LF (GLOVE) ×2
GLOVE SKINSENSE STRL SZ8.0 LF (GLOVE) ×1 IMPLANT
GLOVE SS N UNI LF 8.5 STRL (GLOVE) ×3 IMPLANT
GOWN STRL REUS W/ TWL LRG LVL3 (GOWN DISPOSABLE) ×1 IMPLANT
GOWN STRL REUS W/TWL LRG LVL3 (GOWN DISPOSABLE) ×2
GOWN STRL REUS W/TWL XL LVL3 (GOWN DISPOSABLE) ×3 IMPLANT
HAND ALUMI XLG (SOFTGOODS) ×3 IMPLANT
KIT ROOM TURNOVER APOR (KITS) ×3 IMPLANT
MANIFOLD NEPTUNE II (INSTRUMENTS) ×3 IMPLANT
NEEDLE HYPO 21X1.5 SAFETY (NEEDLE) ×3 IMPLANT
NS IRRIG 1000ML POUR BTL (IV SOLUTION) ×3 IMPLANT
PACK BASIC LIMB (CUSTOM PROCEDURE TRAY) ×3 IMPLANT
PAD ARMBOARD 7.5X6 YLW CONV (MISCELLANEOUS) ×3 IMPLANT
SET BASIN LINEN APH (SET/KITS/TRAYS/PACK) ×3 IMPLANT
SUT ETHILON 3 0 FSL (SUTURE) ×3 IMPLANT
SYR CONTROL 10ML LL (SYRINGE) ×3 IMPLANT

## 2016-03-17 NOTE — Anesthesia Preprocedure Evaluation (Signed)
Anesthesia Evaluation  Patient identified by MRN, date of birth, ID band Patient awake    Reviewed: Allergy & Precautions, NPO status , Patient's Chart, lab work & pertinent test results  Airway Mallampati: II   Neck ROM: Full    Dental  (+) Teeth Intact   Pulmonary neg pulmonary ROS,    breath sounds clear to auscultation       Cardiovascular hypertension, Pt. on medications  Rhythm:Regular Rate:Normal     Neuro/Psych    GI/Hepatic   Endo/Other  diabetes, Type 2, Oral Hypoglycemic AgentsHypothyroidism   Renal/GU      Musculoskeletal   Abdominal   Peds  Hematology   Anesthesia Other Findings   Reproductive/Obstetrics                             Anesthesia Physical Anesthesia Plan  ASA: III  Anesthesia Plan: Bier Block   Post-op Pain Management:    Induction: Intravenous  Airway Management Planned: Simple Face Mask  Additional Equipment:   Intra-op Plan:   Post-operative Plan:   Informed Consent: I have reviewed the patients History and Physical, chart, labs and discussed the procedure including the risks, benefits and alternatives for the proposed anesthesia with the patient or authorized representative who has indicated his/her understanding and acceptance.     Plan Discussed with:   Anesthesia Plan Comments:         Anesthesia Quick Evaluation

## 2016-03-17 NOTE — Progress Notes (Signed)
Incentive Spirometry education complete.  

## 2016-03-17 NOTE — Op Note (Signed)
03/17/2016  9:43 AM  PATIENT:  Kathleen [name]  70 y.o. female  PRE-OPERATIVE DIAGNOSIS:  left carpal tunnel syndrome  POST-OPERATIVE DIAGNOSIS:  left carpal tunnel syndrome  PROCEDURE:  Procedure(s): CARPAL TUNNEL RELEASE (Left)  SURGEON:  Surgeon(s) and Role:    * Orlene Salmons E Kanai Berrios, MD - Primary  PHYSICIAN ASSISTANT:   ASSISTANTS: none   ANESTHESIA:   regional  EBL:  Total I/O In: 300 [I.V.:300] Out: 0   BLOOD ADMINISTERED:none  DRAINS: none   LOCAL MEDICATIONS USED:  MARCAINE     SPECIMEN:  No Specimen  DISPOSITION OF SPECIMEN:  N/A  COUNTS:  YES  TOURNIQUET:   Total Tourniquet Time Documented: Upper Arm (Left) - 26 minutes Total: Upper Arm (Left) - 26 minutes   DICTATION: .Dragon Dictation  PLAN OF CARE: Discharge to home after PACU  PATIENT DISPOSITION:  PACU - hemodynamically stable.   Delay start of Pharmacological VTE agent (>24hrs) due to surgical blood loss or risk of bleeding: not applicable  Carpal tunnel release left wrist  Preop diagnosis carpal tunnel syndrome left wrist   postop diagnosis same  Procedure open carpal tunnel release left wrist Surgeon Raven Harmes  Anesthesia regional Bier block  Operative findings compression of the left median nerve without any anterior carpal tunnel masses   Indications failure of conservative treatment to relieve pain and paresthesias and numbness and tingling of the left hand  The patient was identified in the preop area we confirm the surgical site marked as left wrist. Chart update completed. Patient taken to surgery. She had 2 g of Ancef. After establishing a Bier block left her arm was prepped with ChloraPrep.  Timeout executed completed and confirmed site.  A straight incision was made over the left carpal tunnel in line with the radial border of the ring finger. Blunt dissection was carried out to find the distal aspect of the carpal tunnel. A blunted instrument was passed beneath the carpal  tunnel. Sharp incision was then used to release the transverse carpal ligament. The contents of the carpal tunnel were inspected. The median nerve was compressed with slight discoloration.  The wound was irrigated and then closed with 3-0 nylon suture. We injected 10 mL of plain Marcaine on the radial side of the incision  A sterile bandage was applied and the tourniquet was released the color of the hand and capillary refill were normal  The patient was taken to the recovery room in stable condition  [id number]   

## 2016-03-17 NOTE — Anesthesia Postprocedure Evaluation (Signed)
Anesthesia Post Note  Patient: Kathleen [name]  Procedure(s) Performed: Procedure(s) (LRB): CARPAL TUNNEL RELEASE (Left)  Patient location during evaluation: PACU Anesthesia Type: MAC Level of consciousness: awake and alert and oriented Pain management: pain level controlled Vital Signs Assessment: post-procedure vital signs reviewed and stable Respiratory status: spontaneous breathing Cardiovascular status: blood pressure returned to baseline and stable Postop Assessment: no signs of nausea or vomiting Anesthetic complications: no     Last Vitals:  Vitals:   03/17/16 0845 03/17/16 0900  BP: 119/70 (!) 108/56  Pulse:    Resp: 16 15  Temp:      Last Pain:  Vitals:   03/17/16 0743  TempSrc: Oral                 Natally Ribera

## 2016-03-17 NOTE — Transfer of Care (Signed)
Immediate Anesthesia Transfer of Care Note  Patient: Kathleen [name]  Procedure(s) Performed: Procedure(s): CARPAL TUNNEL RELEASE (Left)  Patient Location: PACU  Anesthesia Type:Bier block  Level of Consciousness: awake, alert  and oriented  Airway & Oxygen Therapy: Patient Spontanous Breathing and Patient connected to nasal cannula oxygen  Post-op Assessment: Report given to RN  Post vital signs: Reviewed and stable  Last Vitals:  Vitals:   03/17/16 0845 03/17/16 0900  BP: 119/70 (!) 108/56  Pulse:    Resp: 16 15  Temp:      Last Pain:  Vitals:   03/17/16 0743  TempSrc: Oral         Complications: No apparent anesthesia complications

## 2016-03-17 NOTE — Interval H&P Note (Signed)
History and Physical Interval Note:  03/17/2016 8:45 AM BP 119/66   Pulse 76   Temp 98.1 F (36.7 C) (Oral)   Resp 14   Ht 5\' 3"  (1.6 m)   Wt 183 lb (83 kg)   SpO2 96%   BMI 32.42 kg/m   Skin - no sores or suspicious lesions or rashes or color changes left hand normal   Margot AblesBetty O Mezera  has presented today for surgery, with the diagnosis of left carpal tunnel syndrome  The various methods of treatment have been discussed with the patient and family. After consideration of risks, benefits and other options for treatment, the patient has consented to  Procedure(s): CARPAL TUNNEL RELEASE (Left) as a surgical intervention .  The patient's history has been reviewed, patient examined, no change in status, stable for surgery.  I have reviewed the patient's chart and labs.  Questions were answered to the patient's satisfaction.     Fuller CanadaStanley Debrina Kizer

## 2016-03-17 NOTE — Brief Op Note (Signed)
03/17/2016  9:43 AM  PATIENT:  Kathleen [name] Date  70 y.[name]. female  PRE-OPERATIVE DIAGNOSIS:  left carpal tunnel syndrome  POST-OPERATIVE DIAGNOSIS:  left carpal tunnel syndrome  PROCEDURE:  Procedure(s): CARPAL TUNNEL RELEASE (Left)  SURGEON:  Surgeon(s) and Role:    * Vickki HearingStanley E Rico Massar, MD - Primary  PHYSICIAN ASSISTANT:   ASSISTANTS: none   ANESTHESIA:   regional  EBL:  Total [name] In: 300 [I.V.:300] Out: 0   BLOOD ADMINISTERED:none  DRAINS: none   LOCAL MEDICATIONS USED:  MARCAINE     SPECIMEN:  No Specimen  DISPOSITION OF SPECIMEN:  N/A  COUNTS:  YES  TOURNIQUET:   Total Tourniquet Time Documented: Upper Arm (Left) - 26 minutes Total: Upper Arm (Left) - 26 minutes   DICTATION: .[name] Dictation  PLAN OF CARE: Discharge to home after PACU  PATIENT DISPOSITION:  PACU - hemodynamically stable.   Delay start of Pharmacological VTE agent (>24hrs) due to surgical blood loss or risk of bleeding: not applicable  Carpal tunnel release left wrist  Preop diagnosis carpal tunnel syndrome left wrist   postop diagnosis same  Procedure open carpal tunnel release left wrist Surgeon Romeo AppleHarrison  Anesthesia regional Bier block  Operative findings compression of the left median nerve without any anterior carpal tunnel masses   Indications failure of conservative treatment to relieve pain and paresthesias and numbness and tingling of the left hand  The patient was identified in the preop area we confirm the surgical site marked as left wrist. Chart update completed. Patient taken to surgery. She had 2 g of Ancef. After establishing a Bier block left her arm was prepped with ChloraPrep.  Timeout executed completed and confirmed site.  A straight incision was made over the left carpal tunnel in line with the radial border of the ring finger. Blunt dissection was carried out to find the distal aspect of the carpal tunnel. A blunted instrument was passed beneath the carpal  tunnel. Sharp incision was then used to release the transverse carpal ligament. The contents of the carpal tunnel were inspected. The median nerve was compressed with slight discoloration.  The wound was irrigated and then closed with 3-0 nylon suture. We injected 10 mL of plain Marcaine on the radial side of the incision  A sterile bandage was applied and the tourniquet was released the color of the hand and capillary refill were normal  The patient was taken to the recovery room in stable condition  [phone]

## 2016-03-17 NOTE — Discharge Instructions (Signed)
Elevate the hand for 72 hours.  °Apply ice packs as needed to control swelling.  °Moves your fingers every hour opening and closing the hand to make a closed fist 10 times per hour while awake °Keep dressing clean and dry doctor will change in the office °

## 2016-03-17 NOTE — H&P (View-Only) (Signed)
Incentive Spirometry education complete.

## 2016-03-17 NOTE — Anesthesia Procedure Notes (Signed)
Anesthesia Regional Block:  Bier block (IV Regional)  Pre-Anesthetic Checklist: ,, timeout performed, Correct Patient, Correct Site, Correct Laterality, Correct Procedure,, site marked, surgical consent,, at surgeon's request  Laterality: Left     Needles:  Injection technique: Single-shot  Needle Type: Other      Needle Gauge: 22 and 22 G    Additional Needles: Bier block (IV Regional)  Nerve Stimulator or Paresthesia:   Additional Responses:  Pulse checked post tourniquet inflation. IV NSL discontinued post injection. Narrative:  Start time: 03/17/2016 9:16 AM  Performed by: Personally

## 2016-03-22 ENCOUNTER — Encounter (HOSPITAL_COMMUNITY): Payer: Self-pay | Admitting: Orthopedic Surgery

## 2016-03-24 ENCOUNTER — Ambulatory Visit: Payer: Medicare Other | Admitting: Orthopedic Surgery

## 2016-03-24 ENCOUNTER — Encounter: Payer: Self-pay | Admitting: Orthopedic Surgery

## 2016-03-24 DIAGNOSIS — Z4889 Encounter for other specified surgical aftercare: Secondary | ICD-10-CM

## 2016-03-24 NOTE — Progress Notes (Signed)
WOUND CHECK LEFT HAND, WOUND CLEAN AND DRY, NO SIGNS OF INFECTION, CLEANED WITH ALCOHOL, GUAZE AND ACE WRAP APPLIED, PATIENT TOLERATED WELL, POST OP APPOINTMENT GIVEN FOR NEXT WEEK, POST OP DAY 12 FOR SUTURE REMOVAL

## 2016-03-29 ENCOUNTER — Ambulatory Visit: Payer: Medicare Other | Admitting: Orthopedic Surgery

## 2016-03-29 ENCOUNTER — Encounter: Payer: Self-pay | Admitting: Orthopedic Surgery

## 2016-03-29 ENCOUNTER — Ambulatory Visit (INDEPENDENT_AMBULATORY_CARE_PROVIDER_SITE_OTHER): Payer: Self-pay | Admitting: Orthopedic Surgery

## 2016-03-29 DIAGNOSIS — Z4889 Encounter for other specified surgical aftercare: Secondary | ICD-10-CM

## 2016-03-29 DIAGNOSIS — G5602 Carpal tunnel syndrome, left upper limb: Secondary | ICD-10-CM

## 2016-03-29 NOTE — Progress Notes (Signed)
Patient ID: Margot AblesBetty O Swenson, female   DOB: 11/06/1945, 71 y.o.   MRN: [id number]  Follow up visit/  postop visit postop day number 12  Chief Complaint  Patient presents with  . Follow-up    POST OP, LEFT CARPAL TUNNEL RELEASE, DO 03/17/17    2 weeks postop carpal tunnel release doing well no complaints. Stitches removed wound clean dry and intact patient has full range of motion and no residual carpal tunnel symptoms  Patient released   Encounter Diagnoses  Name Primary?  . Left carpal tunnel syndrome Yes  . Aftercare following surgery     There were no vitals taken for this visit.  4:08 PM Fuller CanadaStanley [name], MD 03/29/2016

## 2016-04-01 ENCOUNTER — Ambulatory Visit: Payer: Medicare Other | Admitting: Orthopedic Surgery

## 2016-04-06 ENCOUNTER — Encounter (HOSPITAL_COMMUNITY): Payer: Self-pay | Admitting: Emergency Medicine

## 2016-04-06 ENCOUNTER — Emergency Department (HOSPITAL_COMMUNITY): Payer: Medicare Other

## 2016-04-06 ENCOUNTER — Emergency Department (HOSPITAL_COMMUNITY)
Admission: EM | Admit: 2016-04-06 | Discharge: 2016-04-06 | Disposition: A | Discharge status: Home / Self Care | Payer: Medicare Other | Attending: Emergency Medicine | Admitting: Emergency Medicine

## 2016-04-06 DIAGNOSIS — E039 Hypothyroidism, unspecified: Secondary | ICD-10-CM | POA: Diagnosis not present

## 2016-04-06 DIAGNOSIS — M545 Low back pain: Secondary | ICD-10-CM | POA: Diagnosis not present

## 2016-04-06 DIAGNOSIS — I10 Essential (primary) hypertension: Secondary | ICD-10-CM | POA: Diagnosis not present

## 2016-04-06 DIAGNOSIS — Z7984 Long term (current) use of oral hypoglycemic drugs: Secondary | ICD-10-CM | POA: Insufficient documentation

## 2016-04-06 DIAGNOSIS — M25551 Pain in right hip: Secondary | ICD-10-CM | POA: Diagnosis present

## 2016-04-06 DIAGNOSIS — E119 Type 2 diabetes mellitus without complications: Secondary | ICD-10-CM | POA: Insufficient documentation

## 2016-04-06 DIAGNOSIS — M25561 Pain in right knee: Secondary | ICD-10-CM | POA: Diagnosis not present

## 2016-04-06 DIAGNOSIS — Z79899 Other long term (current) drug therapy: Secondary | ICD-10-CM | POA: Insufficient documentation

## 2016-04-06 DIAGNOSIS — Z7982 Long term (current) use of aspirin: Secondary | ICD-10-CM | POA: Diagnosis not present

## 2016-04-06 MED ORDER — ONDANSETRON HCL 4 MG PO TABS
4.0000 mg | ORAL_TABLET | Freq: Once | ORAL | Status: AC
  Administered 2016-04-06: 4 mg via ORAL
  Filled 2016-04-06: qty 1

## 2016-04-06 MED ORDER — TRAMADOL HCL 50 MG PO TABS: 50.0000 mg | ORAL_TABLET | Freq: Four times a day (QID) | ORAL | 0 refills | Status: DC | PRN

## 2016-04-06 MED ORDER — IBUPROFEN 400 MG PO TABS
400.0000 mg | ORAL_TABLET | Freq: Once | ORAL | Status: AC
  Administered 2016-04-06: 400 mg via ORAL
  Filled 2016-04-06: qty 1

## 2016-04-06 MED ORDER — HYDROCODONE-ACETAMINOPHEN 5-325 MG PO TABS
2.0000 | ORAL_TABLET | Freq: Once | ORAL | Status: AC
  Administered 2016-04-06: 2 via ORAL
  Filled 2016-04-06: qty 2

## 2016-04-06 NOTE — ED Triage Notes (Signed)
C/o right knee pain, shooting to right hip, rates pain 10/10.  History of bilateral knee replacement.

## 2016-04-06 NOTE — ED Provider Notes (Signed)
[facility] Provider Note   CSN: [id number] Arrival date & time: 04/06/16  1308     History   Chief Complaint Chief Complaint  Patient presents with  . Knee Pain    right    HPI Kathleen [name] is a 71 y.o. female.  The history is provided by the patient.  Knee Pain   Chronicity: acute on chronic. The current episode started 3 to 5 hours ago. The problem occurs hourly. The problem has been gradually worsening. The pain is present in the right knee. The pain is moderate. Associated symptoms include limited range of motion and stiffness. The symptoms are aggravated by activity and standing. She has tried nothing for the symptoms. The treatment provided no relief. There has been no history of extremity trauma.    Past Medical History:  Diagnosis Date  . Arthritis   . Cataract   . Diabetes mellitus without complication (HCC)   . Heart murmur   . Hyperlipidemia   . Hypertension   . Hypothyroidism   . Left carpal tunnel syndrome     Patient Active Problem List   Diagnosis Date Noted  . Left carpal tunnel syndrome   . Overweight 02/02/2016  . LVH (left ventricular hypertrophy) 02/02/2016  . Aortic valve sclerosis 02/02/2016  . Essential hypertension 11/23/2015  . Diabetes mellitus type 2, controlled, without complications (HCC) 11/23/2015  . Hyperlipidemia LDL goal <100 11/23/2015  . Primary osteoarthritis of both knees 11/23/2015  . Hypothyroidism (acquired) 11/23/2015    Past Surgical History:  Procedure Laterality Date  . CARPAL TUNNEL RELEASE Right 2009  . CARPAL TUNNEL RELEASE Left 03/17/2016   Procedure: CARPAL TUNNEL RELEASE;  Surgeon: Vickki HearingStanley E [name], MD;  Location: [facility];  Service: Orthopedics;  Laterality: Left;  . CATARACT EXTRACTION W/PHACO Left 06/26/2014   Procedure: CATARACT EXTRACTION PHACO AND INTRAOCULAR LENS PLACEMENT (IOC);  Surgeon: Gemma PayorKerry Hunt, MD;  Location: [facility];  Service: Ophthalmology;  Laterality: Left;  CDE:8.77  . CATARACT  EXTRACTION W/PHACO Right 07/24/2014   Procedure: CATARACT EXTRACTION PHACO AND INTRAOCULAR LENS PLACEMENT RIGHT EYE;  Surgeon: Gemma PayorKerry Hunt, MD;  Location: [facility];  Service: Ophthalmology;  Laterality: Right;  CDE 8.01  . JOINT REPLACEMENT    . ROTATOR CUFF REPAIR Right 2002  . ROTATOR CUFF REPAIR Left 2013  . TOTAL KNEE ARTHROPLASTY Bilateral 2003  . TUBAL LIGATION      OB History    No data available       Home Medications    Prior to Admission medications   Medication Sig Start Date End Date Taking? Authorizing Provider  ACCU-CHEK AVIVA PLUS test strip  10/15/15  Yes Historical Provider, MD  acetaminophen (TYLENOL) 500 MG tablet Take 1,000 mg by mouth every 6 (six) hours as needed for mild pain.   Yes Historical Provider, MD  amLODipine-benazepril (LOTREL) 5-20 MG per capsule Take 1 capsule by mouth daily.  12/11/13  Yes Historical Provider, MD  aspirin 81 MG tablet Take 162 mg by mouth daily. Takes 2 tablets in the morning   Yes Historical Provider, MD  atorvastatin (LIPITOR) 40 MG tablet Take 40 mg by mouth daily.   Yes Historical Provider, MD  calcium carbonate (OSCAL) 1500 (600 Ca) MG TABS tablet Take 600 mg of elemental calcium by mouth daily with breakfast.   Yes Historical Provider, MD  carboxymethylcellulose (REFRESH PLUS) 0.5 % SOLN Place 1 drop into both eyes as needed (dry eyes).   Yes Historical Provider, MD  Cholecalciferol (VITAMIN D3) 2000  UNITS TABS Take 1 tablet by mouth daily.    Yes Historical Provider, MD  labetalol (NORMODYNE) 200 MG tablet Take 200 mg by mouth daily. 09/08/15  Yes Historical Provider, MD  levothyroxine (SYNTHROID, LEVOTHROID) 88 MCG tablet Take 88 mcg by mouth daily before breakfast.   Yes Historical Provider, MD  Pediatric Multivit-Minerals-C (RA GUMMY VITAMINS & MINERALS PO) Take 2 tablets by mouth daily.   Yes Historical Provider, MD  Pyridoxine HCl (B-6) 100 MG TABS Take 1 tablet by mouth 2 (two) times daily. Patient taking differently: Take 1  tablet by mouth daily.  01/04/16  Yes Vickki Hearing, MD  sitaGLIPtin-metformin (JANUMET) 50-500 MG tablet Take 1 tablet by mouth daily. 11/23/15  Yes Eustace Moore, MD  triamterene-hydrochlorothiazide (DYAZIDE) 37.5-25 MG per capsule Take 1 capsule by mouth daily.  12/11/13  Yes Historical Provider, MD  HYDROcodone-acetaminophen (NORCO/VICODIN) 5-325 MG tablet Take 1 tablet by mouth every 4 (four) hours as needed for moderate pain. Patient not taking: Reported on 04/06/2016 03/17/16   Vickki Hearing, MD    Family History Family History  Problem Relation Age of Onset  . Cancer Mother 58    breast  . Asthma Father   . Heart disease Maternal Uncle     several uncles with heart disease    Social History Social History  Substance Use Topics  . Smoking status: Never Smoker  . Smokeless tobacco: Never Used  . Alcohol use No     Allergies   Patient has no known allergies.   Review of Systems Review of Systems  Constitutional: Negative for activity change.       All ROS Neg except as noted in HPI  HENT: Negative for nosebleeds.   Eyes: Negative for photophobia and discharge.  Respiratory: Negative for cough, shortness of breath and wheezing.   Cardiovascular: Negative for chest pain and palpitations.  Gastrointestinal: Negative for abdominal pain and blood in stool.  Genitourinary: Negative for dysuria, frequency and hematuria.  Musculoskeletal: Positive for arthralgias, back pain and stiffness. Negative for neck pain.  Skin: Negative.   Neurological: Negative for dizziness, seizures and speech difficulty.  Psychiatric/Behavioral: Negative for confusion and hallucinations.     Physical Exam Updated Vital Signs BP 164/78 (BP Location: Left Arm)   Pulse 78   Temp 97.9 F (36.6 C)   Resp 20   Ht 5\' 2"  (1.575 m)   Wt 83 kg   SpO2 99%   BMI 33.47 kg/m   Physical Exam  Constitutional: She is oriented to person, place, and time. She appears well-developed and  well-nourished.  Non-toxic appearance.  HENT:  Head: Normocephalic.  Right Ear: Tympanic membrane and external ear normal.  Left Ear: Tympanic membrane and external ear normal.  Eyes: EOM and lids are normal. Pupils are equal, round, and reactive to light.  Neck: Normal range of motion. Neck supple. Carotid bruit is not present.  Cardiovascular: Normal rate, regular rhythm, intact distal pulses and normal pulses.   Murmur heard.  Systolic murmur is present with a grade of 2/6  Pulmonary/Chest: Breath sounds normal. No respiratory distress.  Abdominal: Soft. Bowel sounds are normal. There is no tenderness. There is no guarding.  Musculoskeletal:  There is pain with attempted flexion and extension of the right knee. There is no posterior mass. There is no pain to the right calf. No swelling of the right calf. The dorsalis pedis pulses 2+. There is pain to palpation of the right hip. There is some pain  in the lower lumbar area with attempted range of motion of the right lower extremity.  Lymphadenopathy:       Head (right side): No submandibular adenopathy present.       Head (left side): No submandibular adenopathy present.    She has no cervical adenopathy.  Neurological: She is alert and oriented to person, place, and time. She has normal strength. No cranial nerve deficit or sensory deficit.  Skin: Skin is warm and dry.  Psychiatric: She has a normal mood and affect. Her speech is normal.  Nursing note and vitals reviewed.    ED Treatments / Results  Labs (all labs ordered are listed, but only abnormal results are displayed) Labs Reviewed - No data to display  EKG  EKG Interpretation None       Radiology Dg Knee Complete 4 Views Right  Result Date: 04/06/2016 CLINICAL DATA:  RIGHT knee pain and swelling radiating up to hip since this morning, no known injury, remote knee replacement surgery 15 years ago EXAM: RIGHT KNEE - COMPLETE 4+ VIEW COMPARISON:  11/07/2014 FINDINGS:  Diffuse osseous demineralization. Components of RIGHT knee prosthesis again identified. No acute fracture, dislocation, or bone destruction. No knee joint effusion. IMPRESSION: Osseous demineralization and postsurgical changes of RIGHT TKR. No acute abnormalities. Electronically Signed   By: Ulyses Southward M.D.   On: 04/06/2016 14:12    Procedures Procedures (including critical care time)  Medications Ordered in ED Medications  HYDROcodone-acetaminophen (NORCO/VICODIN) 5-325 MG per tablet 2 tablet (not administered)  ibuprofen (ADVIL,MOTRIN) tablet 400 mg (not administered)  ondansetron (ZOFRAN) tablet 4 mg (not administered)     Initial Impression / Assessment and Plan / ED Course  I have reviewed the triage vital signs and the nursing notes.  Pertinent labs & imaging results that were available during my care of the patient were reviewed by me and considered in my medical decision making (see chart for details).  Clinical Course     **I have reviewed nursing notes, vital signs, and all appropriate lab and imaging results for this patient.*  Final Clinical Impressions(s) / ED Diagnoses  Blood pressure is elevated at 164/78. Vital signs are otherwise within normal limits. X-ray of the right knee show some osseous demineralization. The patient has had a total knee replacement. There no acute abnormalities appreciated at this time. Patient appears uncomfortable. I can reproduce some of the pain with movement of the right lower extremity especially in the hip and lower back area. Patient will have x-ray of the hip and lower back. Patient treated with Norco and ibuprofen for pain.   Pt care to be continue by Mr. Will Dansie, P.A.-C.   Final diagnoses:  None    New Prescriptions New Prescriptions   No medications on file     Ivery Quale, PA-C 04/09/16 1438    Benjiman Core, MD 04/09/16 1601

## 2016-04-06 NOTE — ED Provider Notes (Signed)
[facility] care [facility] PA-C at shift change. Please see his note for further.  Briefly, patient presented with right knee pain, low back pain and right hip pain. She been taking Tylenol with little relief. At shift change patient had unremarkable right knee x-ray. She has previous total knee replacement. At shift change patient is awaiting x-rays of her lumbar spine and right hip. Plan at shift change is for discharge if these are unremarkable. Short course of tramadol to use at home until she can follow-up with her orthopedic surgeon and primary care doctor.  X-rays of her right hip and lumbar spine showed no acute findings. I discussed these findings with the patient. I encouraged her to follow-up with her orthopedic surgeon and primary care doctor. Short course of tramadol provided to use in conjunction with her Tylenol. I discussed strict and specific return precautions. I advised the patient to follow-up with their primary care provider this week. I advised the patient to return to the emergency department with new or worsening symptoms or new concerns. The patient verbalized understanding and agreement with plan.    Right knee pain, unspecified chronicity  Right hip pain  Bilateral low back pain, unspecified chronicity, with sciatica presence unspecified     Everlene FarrierWilliam Gianne Shugars, PA-C 04/06/16 1827    Benjiman CoreNathan Pickering, MD 04/06/16 [phone]

## 2016-04-11 ENCOUNTER — Ambulatory Visit (INDEPENDENT_AMBULATORY_CARE_PROVIDER_SITE_OTHER): Payer: Medicare Other | Admitting: Family Medicine

## 2016-04-11 ENCOUNTER — Encounter: Payer: Self-pay | Admitting: Family Medicine

## 2016-04-11 VITALS — BP 150/76 | HR 76 | Temp 98.0°F | Resp 18 | Ht 62.5 in | Wt 181.0 lb

## 2016-04-11 DIAGNOSIS — M25561 Pain in right knee: Secondary | ICD-10-CM

## 2016-04-11 DIAGNOSIS — Z96653 Presence of artificial knee joint, bilateral: Secondary | ICD-10-CM

## 2016-04-11 DIAGNOSIS — Z23 Encounter for immunization: Secondary | ICD-10-CM

## 2016-04-11 DIAGNOSIS — G8929 Other chronic pain: Secondary | ICD-10-CM | POA: Diagnosis not present

## 2016-04-11 NOTE — Patient Instructions (Signed)
I will refer to Orthopedic for consult regarding knee replacement surgery

## 2016-04-11 NOTE — Progress Notes (Signed)
Chief Complaint  Patient presents with  . Follow-up    [facility] ER   Er follow up Went for knee pain Was bending over at home and had sudden onset severe pain in right back - hip - knee.  Went to the ER and all these x rayed.  No acute finding. Was given tramadol for pain.  Not helping so just takes acetaminophen 500 mg , 1-2 doses a day. Now better - but has progressive R knee pain over time.  I looked at her old ortho note for this and it indicates she has been referred to a joint replacement specialist.  I will re do this referral since it is over 33 months old.  No falls.  No new injury.  NO instability.  Patient Active Problem List   Diagnosis Date Noted  . Left carpal tunnel syndrome   . Overweight 02/02/2016  . LVH (left ventricular hypertrophy) 02/02/2016  . Aortic valve sclerosis 02/02/2016  . Essential hypertension 11/23/2015  . Diabetes mellitus type 2, controlled, without complications (HCC) 11/23/2015  . Hyperlipidemia LDL goal <100 11/23/2015  . Primary osteoarthritis of both knees 11/23/2015  . Hypothyroidism (acquired) 11/23/2015    Outpatient Encounter Prescriptions as of 04/11/2016  Medication Sig  . ACCU-CHEK AVIVA PLUS test strip   . acetaminophen (TYLENOL) 500 MG tablet Take 1,000 mg by mouth every 6 (six) hours as needed for mild pain.  Marland Kitchen amLODipine-benazepril (LOTREL) 5-20 MG per capsule Take 1 capsule by mouth daily.   Marland Kitchen aspirin 81 MG tablet Take 162 mg by mouth daily. Takes 2 tablets in the morning  . atorvastatin (LIPITOR) 40 MG tablet Take 40 mg by mouth daily.  . calcium carbonate (OSCAL) 1500 (600 Ca) MG TABS tablet Take 600 mg of elemental calcium by mouth daily with breakfast.  . carboxymethylcellulose (REFRESH PLUS) 0.5 % SOLN Place 1 drop into both eyes as needed (dry eyes).  . Cholecalciferol (VITAMIN D3) 2000 UNITS TABS Take 1 tablet by mouth daily.   Marland Kitchen labetalol (NORMODYNE) 200 MG tablet Take 200 mg by mouth daily.  Marland Kitchen levothyroxine (SYNTHROID,  LEVOTHROID) 88 MCG tablet Take 88 mcg by mouth daily before breakfast.  . Pediatric Multivit-Minerals-C (RA GUMMY VITAMINS & MINERALS PO) Take 2 tablets by mouth daily.  . Pyridoxine HCl (B-6) 100 MG TABS Take 1 tablet by mouth 2 (two) times daily. (Patient taking differently: Take 1 tablet by mouth daily. )  . sitaGLIPtin-metformin (JANUMET) 50-500 MG tablet Take 1 tablet by mouth daily.  . traMADol (ULTRAM) 50 MG tablet Take 1 tablet (50 mg total) by mouth every 6 (six) hours as needed for moderate pain or severe pain.  Marland Kitchen triamterene-hydrochlorothiazide (DYAZIDE) 37.5-25 MG per capsule Take 1 capsule by mouth daily.   Marland Kitchen HYDROcodone-acetaminophen (NORCO/VICODIN) 5-325 MG tablet Take 1 tablet by mouth every 4 (four) hours as needed for moderate pain. (Patient not taking: Reported on 04/11/2016)   No facility-administered encounter medications on file as of 04/11/2016.     No Known Allergies  Review of Systems  Constitutional: Negative for activity change, appetite change and fatigue.  HENT: Negative for congestion and dental problem.   Eyes: Negative for photophobia and visual disturbance.  Respiratory: Negative for cough and shortness of breath.   Cardiovascular: Negative for chest pain, palpitations and leg swelling.  Gastrointestinal: Negative for blood in stool, constipation and diarrhea.  Genitourinary: Negative for difficulty urinating and frequency.  Musculoskeletal: Positive for arthralgias, gait problem and joint swelling.  Neurological: Positive  for numbness.       Improved after carpal tunnel release  Psychiatric/Behavioral: Negative for dysphoric mood. The patient is not nervous/anxious.     BP (!) 150/76 (BP Location: Right Arm, Patient Position: Sitting, Cuff Size: Normal)   Pulse 76   Temp 98 F (36.7 C) (Oral)   Resp 18   Ht 5' 2.5" (1.588 m)   Wt 181 lb (82.1 kg)   SpO2 97%   BMI 32.58 kg/m   Physical Exam  Constitutional: She is oriented to person, place, and  time. She appears well-developed and well-nourished.  HENT:  Head: Normocephalic and atraumatic.  Mouth/Throat: Oropharynx is clear and moist.  Cardiovascular: Normal rate, regular rhythm and normal heart sounds.   Pulmonary/Chest: Effort normal and breath sounds normal.  Musculoskeletal:  Both knees with healed arthroplasty scars.  R knee has warmth and effusion.  Mild crepitus.  Antalgic gait  Neurological: She is alert and oriented to person, place, and time.  Skin: Skin is warm. No erythema.  Psychiatric: She has a normal mood and affect. Her behavior is normal. Thought content normal.    ASSESSMENT/PLAN:  1. Chronic pain of right knee  - Ambulatory referral to Orthopedic Surgery  2. History of bilateral knee replacement  - Ambulatory referral to Orthopedic Surgery   Patient Instructions  I will refer to Orthopedic for consult regarding knee replacement surgery   Eustace MooreYvonne Sue Demyan Fugate, MD

## 2016-05-31 ENCOUNTER — Other Ambulatory Visit: Payer: Self-pay | Admitting: Family Medicine

## 2016-05-31 NOTE — Telephone Encounter (Signed)
Seen 11 5 18 

## 2016-06-06 ENCOUNTER — Other Ambulatory Visit: Payer: Self-pay

## 2016-06-06 MED ORDER — ATORVASTATIN CALCIUM 40 MG PO TABS: 40.0000 mg | ORAL_TABLET | Freq: Every day | ORAL | 1 refills | Status: DC

## 2016-06-06 MED ORDER — AMLODIPINE BESY-BENAZEPRIL HCL 5-20 MG PO CAPS: 1.0000 | ORAL_CAPSULE | Freq: Every day | ORAL | 1 refills | Status: DC

## 2016-06-06 NOTE — Telephone Encounter (Signed)
Seen 11 5 18 

## 2016-06-08 ENCOUNTER — Other Ambulatory Visit: Payer: Self-pay

## 2016-06-09 ENCOUNTER — Other Ambulatory Visit: Payer: Self-pay

## 2016-06-09 MED ORDER — LEVOTHYROXINE SODIUM 88 MCG PO TABS: 88.0000 ug | ORAL_TABLET | Freq: Every day | ORAL | 3 refills | Status: DC

## 2016-06-09 MED ORDER — TRIAMTERENE-HCTZ 37.5-25 MG PO CAPS: 1.0000 | ORAL_CAPSULE | Freq: Every day | ORAL | 3 refills | Status: DC

## 2016-06-09 MED ORDER — ACCU-CHEK SOFTCLIX LANCET DEV MISC: 5 refills | Status: DC

## 2016-06-09 NOTE — Telephone Encounter (Signed)
Seen 11 5 18 

## 2016-06-13 ENCOUNTER — Telehealth: Payer: Self-pay

## 2016-06-13 ENCOUNTER — Telehealth: Payer: Self-pay | Admitting: Family Medicine

## 2016-06-13 MED ORDER — ATORVASTATIN CALCIUM 40 MG PO TABS: 40.0000 mg | ORAL_TABLET | Freq: Every day | ORAL | 3 refills | Status: DC

## 2016-06-13 MED ORDER — LEVOTHYROXINE SODIUM 88 MCG PO TABS
88.0000 ug | ORAL_TABLET | Freq: Every day | ORAL | 3 refills | Status: DC
Start: 2016-06-13 — End: 2017-05-19

## 2016-06-13 MED ORDER — AMLODIPINE BESY-BENAZEPRIL HCL 5-20 MG PO CAPS: 1.0000 | ORAL_CAPSULE | Freq: Every day | ORAL | 3 refills | Status: DC

## 2016-06-13 MED ORDER — TRIAMTERENE-HCTZ 37.5-25 MG PO CAPS
1.0000 | ORAL_CAPSULE | Freq: Every day | ORAL | 3 refills | Status: DC
Start: 2016-06-13 — End: 2017-07-14

## 2016-06-13 NOTE — Telephone Encounter (Signed)
meds reordered

## 2016-06-13 NOTE — Telephone Encounter (Signed)
Kathleen RhodesBetty is calling c/o medications have ran out and the pharmacy is telling her that they havent heard from us, please advise?

## 2016-06-13 NOTE — Telephone Encounter (Signed)
Called and spoke to pt, reviewed meds, reordered what was needed.

## 2016-07-07 ENCOUNTER — Other Ambulatory Visit: Payer: Self-pay | Admitting: Family Medicine

## 2016-07-07 DIAGNOSIS — Z1239 Encounter for other screening for malignant neoplasm of breast: Secondary | ICD-10-CM

## 2016-07-11 ENCOUNTER — Ambulatory Visit (INDEPENDENT_AMBULATORY_CARE_PROVIDER_SITE_OTHER): Payer: Medicare Other

## 2016-07-11 VITALS — HR 78 | Temp 98.4°F | Ht 63.0 in | Wt 181.1 lb

## 2016-07-11 DIAGNOSIS — Z0000 Encounter for general adult medical examination without abnormal findings: Secondary | ICD-10-CM | POA: Diagnosis not present

## 2016-07-11 DIAGNOSIS — E119 Type 2 diabetes mellitus without complications: Secondary | ICD-10-CM | POA: Diagnosis not present

## 2016-07-11 NOTE — Patient Instructions (Addendum)
Kathleen [name] , Thank you for taking time to come for your Medicare Wellness Visit. I appreciate your ongoing commitment to your health goals. Please review the following plan we discussed and let me know if I can assist you in the future.   Screening recommendations/referrals: Colonoscopy: Up to date, next due 06/2017 Mammogram: Up to date, scheduled for 07/13/2016 Bone Density: Up to date Diabetic eye exam: Will request records from Dr. Charise Killian  Recommended yearly dental visit for hygiene and checkup  Vaccinations: Influenza vaccine: Up to date, next due 10/2016 Pneumococcal vaccine: Up to date, next due 03/2017 Tdap vaccine: Due now, please check with your previous pharmacy to see when this was last administered. Shingles vaccine: Up to date    Advanced directives: Advance directive discussed with you today and a copy was placed in your chart.   Conditions/risks identified: Obese, recommend starting a routine exercise program at least 3 days a week for 30-45 minutes at a time as tolerated.   Next appointment: Follow up with Dr. Delton See on 08/01/2016 at 9:00 am. Follow up in 1 year for your annual wellness visit.   Preventive Care 5 Years and Older, Female Preventive care refers to lifestyle choices and visits with your health care provider that can promote health and wellness. What does preventive care include?  A yearly physical exam. This is also called an annual well check.  Dental exams once or twice a year.  Routine eye exams. Ask your health care provider how often you should have your eyes checked.  Personal lifestyle choices, including:  Daily care of your teeth and gums.  Regular physical activity.  Eating a healthy diet.  Avoiding tobacco and drug use.  Limiting alcohol use.  Practicing safe sex.  Taking low-dose aspirin every day.  Taking vitamin and mineral supplements as recommended by your health care provider. What happens during an annual well check? The  services and screenings done by your health care provider during your annual well check will depend on your age, overall health, lifestyle risk factors, and family history of disease. Counseling  Your health care provider may ask you questions about your:  Alcohol use.  Tobacco use.  Drug use.  Emotional well-being.  Home and relationship well-being.  Sexual activity.  Eating habits.  History of falls.  Memory and ability to understand (cognition).  Work and work Astronomer.  Reproductive health. Screening  You may have the following tests or measurements:  Height, weight, and BMI.  Blood pressure.  Lipid and cholesterol levels. These may be checked every 5 years, or more frequently if you are over 34 years old.  Skin check.  Lung cancer screening. You may have this screening every year starting at age 37 if you have a 30-pack-year history of smoking and currently smoke or have quit within the past 15 years.  Fecal occult blood test (FOBT) of the stool. You may have this test every year starting at age 33.  Flexible sigmoidoscopy or colonoscopy. You may have a sigmoidoscopy every 5 years or a colonoscopy every 10 years starting at age 40.  Hepatitis C blood test.  Hepatitis B blood test.  Sexually transmitted disease (STD) testing.  Diabetes screening. This is done by checking your blood sugar (glucose) after you have not eaten for a while (fasting). You may have this done every 1-3 years.  Bone density scan. This is done to screen for osteoporosis. You may have this done starting at age 45.  Mammogram. This may be  done every 1-2 years. Talk to your health care provider about how often you should have regular mammograms. Talk with your health care provider about your test results, treatment options, and if necessary, the need for more tests. Vaccines  Your health care provider may recommend certain vaccines, such as:  Influenza vaccine. This is recommended  every year.  Tetanus, diphtheria, and acellular pertussis (Tdap, Td) vaccine. You may need a Td booster every 10 years.  Zoster vaccine. You may need this after age 24.  Pneumococcal 13-valent conjugate (PCV13) vaccine. One dose is recommended after age 28.  Pneumococcal polysaccharide (PPSV23) vaccine. One dose is recommended after age 23. Talk to your health care provider about which screenings and vaccines you need and how often you need them. This information is not intended to replace advice given to you by your health care provider. Make sure you discuss any questions you have with your health care provider. Document Released: 04/10/2015 Document Revised: 12/02/2015 Document Reviewed: 01/13/2015 Elsevier Interactive Patient Education  2017 [place] Prevention in the Home Falls can cause injuries. They can happen to people of all ages. There are many things you can do to make your home safe and to help prevent falls. What can I do on the outside of my home?  Regularly fix the edges of walkways and driveways and fix any cracks.  Remove anything that might make you trip as you walk through a door, such as a raised step or threshold.  Trim any bushes or trees on the path to your home.  Use bright outdoor lighting.  Clear any walking paths of anything that might make someone trip, such as rocks or tools.  Regularly check to see if handrails are loose or broken. Make sure that both sides of any steps have handrails.  Any raised decks and porches should have guardrails on the edges.  Have any leaves, snow, or ice cleared regularly.  Use sand or salt on walking paths during winter.  Clean up any spills in your garage right away. This includes oil or grease spills. What can I do in the bathroom?  Use night lights.  Install grab bars by the toilet and in the tub and shower. Do not use towel bars as grab bars.  Use non-skid mats or decals in the tub or shower.  If  you need to sit down in the shower, use a plastic, non-slip stool.  Keep the floor dry. Clean up any water that spills on the floor as soon as it happens.  Remove soap buildup in the tub or shower regularly.  Attach bath mats securely with double-sided non-slip rug tape.  Do not have throw rugs and other things on the floor that can make you trip. What can I do in the bedroom?  Use night lights.  Make sure that you have a light by your bed that is easy to reach.  Do not use any sheets or blankets that are too big for your bed. They should not hang down onto the floor.  Have a firm chair that has side arms. You can use this for support while you get dressed.  Do not have throw rugs and other things on the floor that can make you trip. What can I do in the kitchen?  Clean up any spills right away.  Avoid walking on wet floors.  Keep items that you use a lot in easy-to-reach places.  If you need to reach something above you, use  a strong step stool that has a grab bar.  Keep electrical cords out of the way.  Do not use floor polish or wax that makes floors slippery. If you must use wax, use non-skid floor wax.  Do not have throw rugs and other things on the floor that can make you trip. What can I do with my stairs?  Do not leave any items on the stairs.  Make sure that there are handrails on both sides of the stairs and use them. Fix handrails that are broken or loose. Make sure that handrails are as long as the stairways.  Check any carpeting to make sure that it is firmly attached to the stairs. Fix any carpet that is loose or worn.  Avoid having throw rugs at the top or bottom of the stairs. If you do have throw rugs, attach them to the floor with carpet tape.  Make sure that you have a light switch at the top of the stairs and the bottom of the stairs. If you do not have them, ask someone to add them for you. What else can I do to help prevent falls?  Wear shoes  that:  Do not have high heels.  Have rubber bottoms.  Are comfortable and fit you well.  Are closed at the toe. Do not wear sandals.  If you use a stepladder:  Make sure that it is fully opened. Do not climb a closed stepladder.  Make sure that both sides of the stepladder are locked into place.  Ask someone to hold it for you, if possible.  Clearly mark and make sure that you can see:  Any grab bars or handrails.  First and last steps.  Where the edge of each step is.  Use tools that help you move around (mobility aids) if they are needed. These include:  Canes.  Walkers.  Scooters.  Crutches.  Turn on the lights when you go into a dark area. Replace any light bulbs as soon as they burn out.  Set up your furniture so you have a clear path. Avoid moving your furniture around.  If any of your floors are uneven, fix them.  If there are any pets around you, be aware of where they are.  Review your medicines with your doctor. Some medicines can make you feel dizzy. This can increase your chance of falling. Ask your doctor what other things that you can do to help prevent falls. This information is not intended to replace advice given to you by your health care provider. Make sure you discuss any questions you have with your health care provider. Document Released: 01/08/2009 Document Revised: 08/20/2015 Document Reviewed: 04/18/2014 Elsevier Interactive Patient Education  2017 [employer].

## 2016-07-11 NOTE — Progress Notes (Signed)
Subjective:   Kathleen [name] is a 71 y.o. female who presents for an Initial Medicare Annual Wellness Visit.  Review of Systems:  Cardiac Risk Factors include: advanced age (>37men, >17 women);diabetes mellitus;dyslipidemia;hypertension;obesity (BMI >30kg/m2);sedentary lifestyle     Objective:    Today's Vitals   07/11/16 0924  Pulse: 78  Temp: 98.4 F (36.9 C)  TempSrc: Oral  SpO2: 96%  Weight: 181 lb 1.9 oz (82.2 kg)  Height:  (1.6 m)   Body mass index is 32.08 kg/m.   Current Medications (verified) Outpatient Encounter Prescriptions as of 07/11/2016  Medication Sig  . ACCU-CHEK AVIVA PLUS test strip   . acetaminophen (TYLENOL) 500 MG tablet Take 1,000 mg by mouth every 6 (six) hours as needed for mild pain.  Marland Kitchen amLODipine-benazepril (LOTREL) 5-20 MG capsule Take 1 capsule by mouth daily.  Marland Kitchen aspirin 81 MG tablet Take 162 mg by mouth daily. Takes 2 tablets in the morning  . atorvastatin (LIPITOR) 40 MG tablet Take 1 tablet (40 mg total) by mouth daily.  . calcium carbonate (OSCAL) 1500 (600 Ca) MG TABS tablet Take 600 mg of elemental calcium by mouth daily with breakfast.  . carboxymethylcellulose (REFRESH PLUS) 0.5 % SOLN Place 1 drop into both eyes as needed (dry eyes).  . Cholecalciferol (VITAMIN D3) 2000 UNITS TABS Take 1 tablet by mouth daily.   Marland Kitchen JANUMET 50-500 MG tablet TAKE 1 TABLET BY MOUTH  DAILY  . labetalol (NORMODYNE) 200 MG tablet Take 200 mg by mouth 2 (two) times daily.   Dem[employer] (ACCU-CHEK SOFTCLIX) lancets Test twice daily  . levothyroxine (SYNTHROID, LEVOTHROID) 88 MCG tablet Take 1 tablet (88 mcg total) by mouth daily.  . Pediatric Multivit-Minerals-C (RA GUMMY VITAMINS & MINERALS PO) Take 1 tablet by mouth daily.   . Pyridoxine HCl (B-6) 100 MG TABS Take 1 tablet by mouth 2 (two) times daily. (Patient taking differently: Take 1 tablet by mouth daily. )  . triamterene-hydrochlorothiazide (DYAZIDE) 37.5-25 MG capsule Take 1 each (1 capsule  total) by mouth daily.  . [DISCONTINUED] HYDROcodone-acetaminophen (NORCO/VICODIN) 5-325 MG tablet Take 1 tablet by mouth every 4 (four) hours as needed for moderate pain. (Patient not taking: Reported on 04/11/2016)  . [DISCONTINUED] levothyroxine (SYNTHROID, LEVOTHROID) 88 MCG tablet Take 88 mcg by mouth daily before breakfast.  . [DISCONTINUED] traMADol (ULTRAM) 50 MG tablet Take 1 tablet (50 mg total) by mouth every 6 (six) hours as needed for moderate pain or severe pain.  . [DISCONTINUED] triamterene-hydrochlorothiazide (DYAZIDE) 37.5-25 MG per capsule Take 1 capsule by mouth daily.    No facility-administered encounter medications on file as of 07/11/2016.     Allergies (verified) Patient has no known allergies.   History: Past Medical History:  Diagnosis Date  . Arthritis   . Cataract   . Diabetes mellitus without complication (HCC)   . Heart murmur   . Hyperlipidemia   . Hypertension   . Hypothyroidism   . Left carpal tunnel syndrome    Past Surgical History:  Procedure Laterality Date  . CARPAL TUNNEL RELEASE Right 2009  . CARPAL TUNNEL RELEASE Left 03/17/2016   Procedure: CARPAL TUNNEL RELEASE;  Surgeon: Vickki Hearing, MD;  Location: [facility];  Service: Orthopedics;  Laterality: Left;  . CATARACT EXTRACTION W/PHACO Left 06/26/2014   Procedure: CATARACT EXTRACTION PHACO AND INTRAOCULAR LENS PLACEMENT (IOC);  Surgeon: Gemma Payor, MD;  Location: [facility];  Service: Ophthalmology;  Laterality: Left;  CDE:8.77  . CATARACT EXTRACTION W/PHACO Right 07/24/2014  Procedure: CATARACT EXTRACTION PHACO AND INTRAOCULAR LENS PLACEMENT RIGHT EYE;  Surgeon: Gemma Payor, MD;  Location: [facility];  Service: Ophthalmology;  Laterality: Right;  CDE 8.01  . JOINT REPLACEMENT    . ROTATOR CUFF REPAIR Right 2002  . ROTATOR CUFF REPAIR Left 2013  . TOTAL KNEE ARTHROPLASTY Bilateral 2003  . TUBAL LIGATION     Family History  Problem Relation Age of Onset  . Breast cancer Mother 57  . Asthma  Father   . Hypertension Sister   . Hypertension Daughter   . Rheum arthritis Daughter   . Hypertension Son   . Hypertension Sister   . Thyroid disease Sister   . Hypertension Son   . Heart disease Maternal Uncle     several uncles with heart disease   Social History   Occupational History  . housekeeper    Social History Main Topics  . Smoking status: Never Smoker  . Smokeless tobacco: Never Used  . Alcohol use No  . Drug use: No  . Sexual activity: Not Currently    Tobacco Counseling Counseling given: Not Answered   Activities of Daily Living In your present state of health, do you have any difficulty performing the following activities: 07/11/2016 04/11/2016  Hearing? Y N  Vision? Y N  Difficulty concentrating or making decisions? N N  Walking or climbing stairs? Y N  Dressing or bathing? N N  Doing errands, shopping? Y N  Preparing Food and eating ? N -  Using the Toilet? N -  In the past six months, have you accidently leaked urine? N -  Do you have problems with loss of bowel control? N -  Managing your Medications? N -  Managing your Finances? N -  Housekeeping or managing your Housekeeping? N -  Some recent data might be hidden    Immunizations and Health Maintenance Immunization History  Administered Date(s) Administered  . Influenza,inj,Quad PF,36+ Mos 11/23/2015  . Pneumococcal Conjugate-13 04/11/2016   Health Maintenance Due  Topic Date Due  . Hepatitis C Screening  [birth year]  . FOOT EXAM  09/[birth year]/1953  . OPHTHALMOLOGY EXAM  09/25/1953  . TETANUS/TDAP  09/26/1962    Patient Care Team: Eustace Moore, MD as PCP - General (Family Medicine)  Indicate any recent Medical Services you may have received from other than [facility] providers in the past year (date may be approximate).     Assessment:   This is a routine wellness examination for Kathleen [name].  Hearing/Vision screen  Visual Acuity Screening   Right eye Left eye Both eyes  Without correction:   With correction:       Dietary issues and exercise activities discussed: Current Exercise Habits: The patient does not participate in regular exercise at present, Exercise limited by: None identified  Goals    . Exercise 3x per week (30 min per time)          Recommend starting a routine exercise program at least 3 days a week for 30-45 minutes at a time as tolerated.        Depression Screen PHQ 2/9 Scores 07/11/2016 04/11/2016 02/02/2016 11/23/2015  PHQ - 2 Score 0 0 0 0    Fall Risk Fall Risk  07/11/2016 04/11/2016 02/02/2016 11/23/2015  Falls in the past year? No No No No    Cognitive Function: Normal by direct observation   Screening Tests Health Maintenance  Topic Date Due  . Hepatitis C Screening  December 14, 1945  . FOOT EXAM  09/[birth year]1953  . OPHTHALMOLOGY EXAM  09/25/1953  . TETANUS/TDAP  09/26/1962  . HEMOGLOBIN A1C  08/01/2016  . INFLUENZA VACCINE  10/26/2016  . PNA vac Low Risk Adult (2 of 2 - PPSV23) 04/11/2017  . MAMMOGRAM  07/08/2017  . COLONOSCOPY  07/11/2017  . DEXA SCAN  Completed      Plan:    I have personally reviewed and addressed the Medicare Annual Wellness questionnaire and have noted the following in the patient's chart:  A. Medical and social history B. Use of alcohol, tobacco or illicit drugs  C. Current medications and supplements D. Functional ability and status E.  Nutritional status F.  Physical activity G. Advance directives H. List of other physicians I.  Hospitalizations, surgeries, and ER visits in previous 12 months J.  Vitals K. Screenings to include cognitive, depression, and falls L. Referrals and appointments - referral to podiatry for diabetic foot exam.  In addition, I have reviewed and discussed with patient certain preventive protocols, quality metrics, and best practice recommendations. A written personalized care plan for preventive services as well as general preventive health recommendations were provided to  patient.  Signed,   Candis Shine, LPN Lead Nurse Health Advisor

## 2016-07-13 ENCOUNTER — Ambulatory Visit (HOSPITAL_COMMUNITY)
Admission: RE | Admit: 2016-07-13 | Discharge: 2016-07-13 | Disposition: A | Discharge status: Home / Self Care | Payer: Medicare Other | Source: Ambulatory Visit | Attending: Family Medicine | Admitting: Family Medicine

## 2016-07-13 DIAGNOSIS — Z1231 Encounter for screening mammogram for malignant neoplasm of breast: Secondary | ICD-10-CM | POA: Diagnosis not present

## 2016-07-13 DIAGNOSIS — Z1239 Encounter for other screening for malignant neoplasm of breast: Secondary | ICD-10-CM

## 2016-07-20 DIAGNOSIS — B351 Tinea unguium: Secondary | ICD-10-CM | POA: Diagnosis not present

## 2016-07-20 DIAGNOSIS — E1140 Type 2 diabetes mellitus with diabetic neuropathy, unspecified: Secondary | ICD-10-CM | POA: Diagnosis not present

## 2016-07-20 DIAGNOSIS — E1151 Type 2 diabetes mellitus with diabetic peripheral angiopathy without gangrene: Secondary | ICD-10-CM | POA: Diagnosis not present

## 2016-07-20 DIAGNOSIS — M79671 Pain in right foot: Secondary | ICD-10-CM | POA: Diagnosis not present

## 2016-08-01 ENCOUNTER — Encounter: Payer: Medicare Other | Admitting: Family Medicine

## 2016-08-01 DIAGNOSIS — E119 Type 2 diabetes mellitus without complications: Secondary | ICD-10-CM | POA: Diagnosis not present

## 2016-08-01 DIAGNOSIS — H40013 Open angle with borderline findings, low risk, bilateral: Secondary | ICD-10-CM | POA: Diagnosis not present

## 2016-08-01 DIAGNOSIS — H26493 Other secondary cataract, bilateral: Secondary | ICD-10-CM | POA: Diagnosis not present

## 2016-08-02 ENCOUNTER — Ambulatory Visit (INDEPENDENT_AMBULATORY_CARE_PROVIDER_SITE_OTHER): Payer: Medicare Other | Admitting: Family Medicine

## 2016-08-02 ENCOUNTER — Encounter: Payer: Self-pay | Admitting: Family Medicine

## 2016-08-02 VITALS — BP 136/70 | HR 76 | Temp 97.7°F | Resp 16 | Ht 63.0 in | Wt 180.0 lb

## 2016-08-02 DIAGNOSIS — Z0000 Encounter for general adult medical examination without abnormal findings: Secondary | ICD-10-CM | POA: Diagnosis not present

## 2016-08-02 DIAGNOSIS — I10 Essential (primary) hypertension: Secondary | ICD-10-CM

## 2016-08-02 DIAGNOSIS — E119 Type 2 diabetes mellitus without complications: Secondary | ICD-10-CM

## 2016-08-02 DIAGNOSIS — Z1159 Encounter for screening for other viral diseases: Secondary | ICD-10-CM

## 2016-08-02 DIAGNOSIS — I358 Other nonrheumatic aortic valve disorders: Secondary | ICD-10-CM | POA: Diagnosis not present

## 2016-08-02 LAB — CBC
HCT: 36.7 % (ref 35.0–45.0)
HEMOGLOBIN: 11.8 g/dL (ref 11.7–15.5)
MCH: 29.1 pg (ref 27.0–33.0)
MCHC: 32.2 g/dL (ref 32.0–36.0)
MCV: 90.6 fL (ref 80.0–100.0)
MPV: 9 fL (ref 7.5–12.5)
Platelets: 455 10*3/uL — ABNORMAL HIGH (ref 140–400)
RBC: 4.05 MIL/uL (ref 3.80–5.10)
RDW: 14.9 % (ref 11.0–15.0)
WBC: 7.9 10*3/uL (ref 3.8–10.8)

## 2016-08-02 NOTE — Patient Instructions (Signed)
Continue to stay as active as you can No change in medicines You are doing well See me in 6 months Call sooner for any problems

## 2016-08-02 NOTE — Progress Notes (Signed)
Chief Complaint  Patient presents with  . Annual Exam   Feels well No complaint Diabetes well controlled, A1c consistently under 7 BP well controlled, on an ACE/ARB Lipids controlled, on a statin Cannot exercise much due to arthritic knees Well balanced diet Eye exam yesterday Podiatry last month Mammogram last month Colo due 2019 Dexa normal 2015   Patient Active Problem List   Diagnosis Date Noted  . Left carpal tunnel syndrome   . Overweight 02/02/2016  . LVH (left ventricular hypertrophy) 02/02/2016  . Aortic valve sclerosis 02/02/2016  . Essential hypertension 11/23/2015  . Diabetes mellitus type 2, controlled, without complications (HCC) 11/23/2015  . Hyperlipidemia LDL goal <100 11/23/2015  . Primary osteoarthritis of both knees 11/23/2015  . Hypothyroidism (acquired) 11/23/2015    Outpatient Encounter Prescriptions as of 08/02/2016  Medication Sig  . ACCU-CHEK AVIVA PLUS test strip   . acetaminophen (TYLENOL) 500 MG tablet Take 1,000 mg by mouth every 6 (six) hours as needed for mild pain.  Marland Kitchen amLODipine-benazepril (LOTREL) 5-20 MG capsule Take 1 capsule by mouth daily.  Marland Kitchen aspirin 81 MG tablet Take 162 mg by mouth daily. Takes 2 tablets in the morning  . atorvastatin (LIPITOR) 40 MG tablet Take 1 tablet (40 mg total) by mouth daily.  . calcium carbonate (OSCAL) 1500 (600 Ca) MG TABS tablet Take 600 mg of elemental calcium by mouth daily with breakfast.  . carboxymethylcellulose (REFRESH PLUS) 0.5 % SOLN Place 1 drop into both eyes as needed (dry eyes).  . Cholecalciferol (VITAMIN D3) 2000 UNITS TABS Take 1 tablet by mouth daily.   Marland Kitchen JANUMET 50-500 MG tablet TAKE 1 TABLET BY MOUTH  DAILY  . labetalol (NORMODYNE) 200 MG tablet Take 200 mg by mouth 2 (two) times daily.   Demetra Shiner Devices (ACCU-CHEK SOFTCLIX) lancets Test twice daily  . levothyroxine (SYNTHROID, LEVOTHROID) 88 MCG tablet Take 1 tablet (88 mcg total) by mouth daily.  . Pediatric Multivit-Minerals-C  (RA GUMMY VITAMINS & MINERALS PO) Take 1 tablet by mouth daily.   . Pyridoxine HCl (B-6) 100 MG TABS Take 1 tablet by mouth 2 (two) times daily. (Patient taking differently: Take 1 tablet by mouth daily. )  . triamterene-hydrochlorothiazide (DYAZIDE) 37.5-25 MG capsule Take 1 each (1 capsule total) by mouth daily.   No facility-administered encounter medications on file as of 08/02/2016.     Past Medical History:  Diagnosis Date  . Arthritis   . Cataract   . Diabetes mellitus without complication (HCC)   . Heart murmur   . Hyperlipidemia   . Hypertension   . Hypothyroidism   . Left carpal tunnel syndrome     Past Surgical History:  Procedure Laterality Date  . CARPAL TUNNEL RELEASE Right 2009  . CARPAL TUNNEL RELEASE Left 03/17/2016   Procedure: CARPAL TUNNEL RELEASE;  Surgeon: Vickki Hearing, MD;  Location: [facility];  Service: Orthopedics;  Laterality: Left;  . CATARACT EXTRACTION W/PHACO Left 06/26/2014   Procedure: CATARACT EXTRACTION PHACO AND INTRAOCULAR LENS PLACEMENT (IOC);  Surgeon: Gemma Payor, MD;  Location: [facility];  Service: Ophthalmology;  Laterality: Left;  CDE:8.77  . CATARACT EXTRACTION W/PHACO Right 07/24/2014   Procedure: CATARACT EXTRACTION PHACO AND INTRAOCULAR LENS PLACEMENT RIGHT EYE;  Surgeon: Gemma Payor, MD;  Location: [facility];  Service: Ophthalmology;  Laterality: Right;  CDE 8.01  . JOINT REPLACEMENT    . ROTATOR CUFF REPAIR Right 2002  . ROTATOR CUFF REPAIR Left 2013  . TOTAL KNEE ARTHROPLASTY Bilateral 2003  .  TUBAL LIGATION      Social History   Social History  . Marital status: Divorced    Spouse name: N/A  . Number of children: 5  . Years of education: N/A   Occupational History  . housekeeper    Social History Main Topics  . Smoking status: Never Smoker  . Smokeless tobacco: Never Used  . Alcohol use No  . Drug use: No  . Sexual activity: Not Currently   Other Topics Concern  . Not on file   Social History Narrative  . No narrative  on file    Family History  Problem Relation Age of Onset  . Breast cancer Mother 77  . Asthma Father   . Hypertension Sister   . Hypertension Daughter   . Rheum arthritis Daughter   . Hypertension Son   . Hypertension Sister   . Thyroid disease Sister   . Hypertension Son   . Heart disease Maternal Uncle     several uncles with heart disease    Review of Systems  Constitutional: Negative for chills, fever and weight loss.  HENT: Negative for congestion and hearing loss.   Eyes: Negative for blurred vision and pain.  Respiratory: Negative for cough and shortness of breath.   Cardiovascular: Negative for chest pain and leg swelling.  Gastrointestinal: Negative for abdominal pain, constipation, diarrhea and heartburn.  Genitourinary: Negative for dysuria and frequency.  Musculoskeletal: Positive for joint pain. Negative for back pain, falls and neck pain.  Neurological: Negative for dizziness, seizures and headaches.  Psychiatric/Behavioral: Negative for depression. The patient is not nervous/anxious and does not have insomnia.     BP 136/70 (BP Location: Right Arm, Patient Position: Sitting, Cuff Size: Normal)   Pulse 76   Temp 97.7 F (36.5 C) (Temporal)   Resp 16   Ht 5\' 3"  (1.6 m)   Wt 180 lb (81.6 kg)   SpO2 98%   BMI 31.89 kg/m   Physical Exam   BP 136/70 (BP Location: Right Arm, Patient Position: Sitting, Cuff Size: Normal)   Pulse 76   Temp 97.7 F (36.5 C) (Temporal)   Resp 16   Ht 5\' 3"  (1.6 m)   Wt 180 lb (81.6 kg)   SpO2 98%   BMI 31.89 kg/m   General Appearance:    Alert, cooperative, no distress, appears stated age  Head:    Normocephalic, without obvious abnormality, atraumatic  Eyes:    PERRL, conjunctiva/corneas clear, EOM's intact, fundi    benign, both eyes  Ears:    Normal TM's and external ear canals, both ears  Nose:   Nares normal, septum midline, mucosa normal, no drainage    or sinus tenderness  Throat:   Lips, mucosa, and tongue  normal; teeth and gums normal  Neck:   Supple, symmetrical, trachea midline, no adenopathy;    thyroid:  no enlargement/tenderness/nodules; no carotid   bruit or JVD  Back:     Symmetric, no curvature, ROM normal, no CVA tenderness  Lungs:     Clear to auscultation bilaterally, respirations unlabored  Chest Wall:    No tenderness or deformity   Heart:    Regular rate and rhythm, S1 and S2 normal, no murmur, rub   or gallop  Breast Exam:    No tenderness, masses, or nipple abnormality  Abdomen:     Soft, non-tender, bowel sounds active all four quadrants,    no masses, no organomegaly  Genitalia:    Normal female without lesion,  discharge or tenderness  Rectal:    Normal tone, normal prostate, no masses or tenderness;   guaiac negative stool  Extremities:   Extremities normal, atraumatic, no cyanosis or edema  Pulses:   2+ and symmetric all extremities  Skin:   Skin color, texture, turgor normal, no rashes or lesions  Lymph nodes:   Cervical, supraclavicular, and axillary nodes normal  Neurologic:   CNII-XII intact, normal strength, sensation and reflexes    throughout   BP 136/70 (BP Location: Right Arm, Patient Position: Sitting, Cuff Size: Normal)   Pulse 76   Temp 97.7 F (36.5 C) (Temporal)   Resp 16   Ht 5\' 3"  (1.6 m)   Wt 180 lb (81.6 kg)   SpO2 98%   BMI 31.89 kg/m   General Appearance:    Alert, cooperative, no distress, appears stated age.  Mod overweight.  Head:    Normocephalic, without obvious abnormality, atraumatic  Eyes:    PERRL, conjunctiva/corneas clear, EOM's intact, fundi    benign, both eyes. pseudophakia  Ears:    Normal TM's and external ear canals, both ears  Nose:   Nares normal, septum midline, mucosa normal, no drainage    or sinus tenderness  Throat:   Lips, mucosa, and tongue normal; teeth and gums normal  Neck:   Supple, symmetrical, trachea midline, no adenopathy;    thyroid:  no enlargement/tenderness/nodules; no carotid   bruit   Back:      Symmetric, no curvature, ROM normal, no CVA tenderness  Lungs:     Clear to auscultation bilaterally, respirations unlabored  Chest Wall:    No tenderness or deformity   Heart:    Regular rate and rhythm, S1 and S2 normal, Short systolic murmur at RUSB, no rub   or gallop  Breast Exam:    No tenderness, masses, or nipple abnormality  Abdomen:     Soft, non-tender, bowel sounds active all four quadrants,    no masses, no organomegaly  Extremities:   Extremities normal, atraumatic, no cyanosis or edema  Pulses:   2+ and symmetric all extremities  Skin:   Skin color, texture, turgor normal, no rashes or lesions  Lymph nodes:   Cervical, supraclavicular, and axillary nodes normal  Neurologic:   Normal strength, sensation and reflexes    Throughout.  Antalgic gait due to painful knee    ASSESSMENT/PLAN:   1. Controlled type 2 diabetes mellitus without complication, without long-term current use of insulin (HCC) - CBC - COMPLETE METABOLIC PANEL WITH GFR - Hemoglobin A1c - Lipid panel - Microalbumin / creatinine urine ratio - Urinalysis, Routine w reflex microscopic  2. Essential hypertension  3. Aortic valve sclerosis  4. Encounter for hepatitis C screening test for low risk patient - Hepatitis C antibody  5. Physical exam, routine    Patient Instructions  Continue to stay as active as you can No change in medicines You are doing well See me in 6 months Call sooner for any problems   Eustace MooreYvonne Sue Katharine Rochefort, MD

## 2016-08-03 ENCOUNTER — Encounter: Payer: Self-pay | Admitting: Family Medicine

## 2016-08-03 LAB — COMPLETE METABOLIC PANEL WITH GFR
ALBUMIN: 4.4 g/dL (ref 3.6–5.1)
ALK PHOS: 87 U/L (ref 33–130)
ALT: 41 U/L — AB (ref 6–29)
AST: 42 U/L — AB (ref 10–35)
BUN: 14 mg/dL (ref 7–25)
CHLORIDE: 104 mmol/L (ref 98–110)
CO2: 23 mmol/L (ref 20–31)
CREATININE: 0.82 mg/dL (ref 0.60–0.93)
Calcium: 10.1 mg/dL (ref 8.6–10.4)
GFR, Est African American: 83 mL/min (ref >=60)
GFR, Est Non African American: 72 mL/min (ref >=60)
GLUCOSE: 86 mg/dL (ref 65–99)
POTASSIUM: 4.3 mmol/L (ref 3.5–5.3)
SODIUM: 140 mmol/L (ref 135–146)
Total Bilirubin: 0.6 mg/dL (ref 0.2–1.2)
Total Protein: 7.3 g/dL (ref 6.1–8.1)

## 2016-08-03 LAB — HEMOGLOBIN A1C
Hgb A1c MFr Bld: 5.5 % (ref <5.7)
Mean Plasma Glucose: 111 mg/dL

## 2016-08-03 LAB — URINALYSIS, MICROSCOPIC ONLY
Bacteria, UA: NONE SEEN [HPF]
CASTS: NONE SEEN [LPF]
Crystals: NONE SEEN [HPF]
RBC / HPF: NONE SEEN RBC/HPF (ref <=2)
YEAST: NONE SEEN [HPF]

## 2016-08-03 LAB — URINALYSIS, ROUTINE W REFLEX MICROSCOPIC
BILIRUBIN URINE: NEGATIVE
GLUCOSE, UA: NEGATIVE
Hgb urine dipstick: NEGATIVE
Ketones, ur: NEGATIVE
Nitrite: NEGATIVE
Protein, ur: NEGATIVE
SPECIFIC GRAVITY, URINE: 1.019 (ref 1.001–1.035)
pH: 5.5 (ref 5.0–8.0)

## 2016-08-03 LAB — LIPID PANEL
Cholesterol: 132 mg/dL (ref <200)
HDL: 48 mg/dL — AB (ref >50)
LDL CALC: 71 mg/dL (ref <100)
TRIGLYCERIDES: 65 mg/dL (ref <150)
Total CHOL/HDL Ratio: 2.8 Ratio (ref <5.0)
VLDL: 13 mg/dL (ref <30)

## 2016-08-03 LAB — MICROALBUMIN / CREATININE URINE RATIO
Creatinine, Urine: 144 mg/dL (ref 20–320)
Microalb Creat Ratio: 7 mcg/mg creat (ref <30)
Microalb, Ur: 1 mg/dL

## 2016-08-03 LAB — HEPATITIS C ANTIBODY: HCV Ab: NEGATIVE

## 2016-11-03 ENCOUNTER — Other Ambulatory Visit: Payer: Self-pay

## 2016-11-03 MED ORDER — LABETALOL HCL 200 MG PO TABS: 200.0000 mg | ORAL_TABLET | Freq: Two times a day (BID) | ORAL | 3 refills | Status: AC

## 2017-02-02 ENCOUNTER — Other Ambulatory Visit: Payer: Self-pay

## 2017-02-02 ENCOUNTER — Encounter: Payer: Self-pay | Admitting: Family Medicine

## 2017-02-02 ENCOUNTER — Ambulatory Visit (INDEPENDENT_AMBULATORY_CARE_PROVIDER_SITE_OTHER): Payer: Medicare Other | Admitting: Family Medicine

## 2017-02-02 VITALS — BP 150/68 | HR 72 | Temp 97.7°F | Resp 16 | Ht 63.0 in | Wt 180.0 lb

## 2017-02-02 DIAGNOSIS — E785 Hyperlipidemia, unspecified: Secondary | ICD-10-CM | POA: Diagnosis not present

## 2017-02-02 DIAGNOSIS — I10 Essential (primary) hypertension: Secondary | ICD-10-CM

## 2017-02-02 DIAGNOSIS — E119 Type 2 diabetes mellitus without complications: Secondary | ICD-10-CM

## 2017-02-02 DIAGNOSIS — E039 Hypothyroidism, unspecified: Secondary | ICD-10-CM | POA: Diagnosis not present

## 2017-02-02 MED ORDER — ACCU-CHEK SOFTCLIX LANCET DEV MISC: 5 refills | Status: AC

## 2017-02-02 MED ORDER — GLUCOSE BLOOD VI STRP: ORAL_STRIP | 5 refills | Status: AC

## 2017-02-02 NOTE — Progress Notes (Addendum)
Chief Complaint  Patient presents with  . Follow-up    6 month   Patient is here for routine follow-up. She has well-controlled diabetes.  Her eye exam is up-to-date.  She sees a podiatrist regularly.  She has no diabetes complications.  She does not check her sugar at home.  Her hemoglobin A1c has been under 7. She has hypertension.  Her blood pressure is well controlled. She has hyperlipidemia.  She is compliant with taking her atorvastatin. She has not gotten regular exercise.  She has joint pain from osteoarthritis. She is on Synthroid for thyroid replacement.  No heat or cold intolerance.  No weight gain or skin /hair change  Patient Active Problem List   Diagnosis Date Noted  . Left carpal tunnel syndrome   . Overweight 02/02/2016  . LVH (left ventricular hypertrophy) 02/02/2016  . Aortic valve sclerosis 02/02/2016  . Essential hypertension 11/23/2015  . Diabetes mellitus type 2, controlled, without complications (HCC) 11/23/2015  . Hyperlipidemia LDL goal <100 11/23/2015  . Primary osteoarthritis of both knees 11/23/2015  . Hypothyroidism (acquired) 11/23/2015    Outpatient Encounter Medications as of 02/02/2017  Medication Sig  . acetaminophen (TYLENOL) 500 MG tablet Take 1,000 mg by mouth every 6 (six) hours as needed for mild pain.  Marland Kitchen. amLODipine-benazepril (LOTREL) 5-20 MG capsule Take 1 capsule by mouth daily.  Marland Kitchen. aspirin 81 MG tablet Take 162 mg by mouth daily. Takes 2 tablets in the morning  . atorvastatin (LIPITOR) 40 MG tablet Take 1 tablet (40 mg total) by mouth daily.  . calcium carbonate (OSCAL) 1500 (600 Ca) MG TABS tablet Take 600 mg of elemental calcium by mouth daily with breakfast.  . carboxymethylcellulose (REFRESH PLUS) 0.5 % SOLN Place 1 drop into both eyes as needed (dry eyes).  . Cholecalciferol (VITAMIN D3) 2000 UNITS TABS Take 1 tablet by mouth daily.   Marland Kitchen. glucose blood (ACCU-CHEK AVIVA PLUS) test strip Test once daily  . JANUMET 50-500 MG tablet  TAKE 1 TABLET BY MOUTH  DAILY  . labetalol (NORMODYNE) 200 MG tablet Take 1 tablet (200 mg total) by mouth 2 (two) times daily.  Demetra Shiner. Lancet Devices (ACCU-CHEK SOFTCLIX) lancets Test once daily  . levothyroxine (SYNTHROID, LEVOTHROID) 88 MCG tablet Take 1 tablet (88 mcg total) by mouth daily.  . Pediatric Multivit-Minerals-C (RA GUMMY VITAMINS & MINERALS PO) Take 1 tablet by mouth daily.   . Pyridoxine HCl (B-6) 100 MG TABS Take 1 tablet by mouth 2 (two) times daily. (Patient taking differently: Take 1 tablet by mouth daily. )  . triamterene-hydrochlorothiazide (DYAZIDE) 37.5-25 MG capsule Take 1 each (1 capsule total) by mouth daily.   No facility-administered encounter medications on file as of 02/02/2017.     No Known Allergies  Review of Systems  Constitutional: Negative for activity change, appetite change and fatigue.  HENT: Negative for congestion and dental problem.   Eyes: Negative for photophobia and visual disturbance.  Respiratory: Negative for cough and shortness of breath.   Cardiovascular: Negative for chest pain, palpitations and leg swelling.  Gastrointestinal: Negative for blood in stool, constipation and diarrhea.  Genitourinary: Negative for difficulty urinating and frequency.  Musculoskeletal: Positive for arthralgias, gait problem and joint swelling.  Neurological: Negative for numbness.  Psychiatric/Behavioral: Negative for dysphoric mood. The patient is not nervous/anxious.     BP (!) 150/68 (BP Location: Right Arm, Patient Position: Sitting, Cuff Size: Normal)   Pulse 72   Temp 97.7 F (36.5 C) (Temporal)   Resp  16   Ht 5\' 3"  (1.6 m)   Wt 180 lb (81.6 kg)   SpO2 96%   BMI 31.89 kg/m   Physical Exam  Constitutional: She is oriented to person, place, and time. She appears well-developed and well-nourished.  Moderately overweight.  Pleasant and congenial  HENT:  Head: Normocephalic and atraumatic.  Mouth/Throat: Oropharynx is clear and moist.  Eyes:  Conjunctivae are normal. Pupils are equal, round, and reactive to light.  Neck: Normal range of motion. No thyromegaly present.  Cardiovascular: Normal rate and regular rhythm.  Murmur heard. Pulmonary/Chest: Effort normal and breath sounds normal.  Musculoskeletal:  Both knees with healed arthroplasty scars.  R knee has warmth and effusion.  Mild crepitus.  Antalgic gait  Neurological: She is alert and oriented to person, place, and time.  Skin: Skin is warm. No erythema.  Psychiatric: She has a normal mood and affect. Her behavior is normal. Thought content normal.    ASSESSMENT/PLAN:  1. Controlled type 2 diabetes mellitus without complication, without long-term current use of insulin (HCC) Check her labs every 6 months. - CBC - COMPLETE METABOLIC PANEL WITH GFR - Hemoglobin A1c - Urinalysis, Routine w reflex microscopic  2. Essential hypertension Controlled  3. Hypothyroidism (acquired) relaced on Synthroid - TSH  4. Hyperlipidemia LDL goal <100 On atorvastatin - Lipid panel   Patient Instructions  See me in 6 months Continue to stay active Lab tests due today I will send you a letter with your test results.  If there is anything of concern, we will call right away. Colonoscopy and mammogram and bone testing to be done next time in the spring    Eustace MooreYvonne Sue Ferrel [place], MD

## 2017-02-02 NOTE — Patient Instructions (Signed)
See me in 6 months Continue to stay active Lab tests due today I will send you a letter with your test results.  If there is anything of concern, we will call right away. Colonoscopy and mammogram and bone testing to be done next time in the spring

## 2017-02-03 ENCOUNTER — Encounter: Payer: Self-pay | Admitting: Family Medicine

## 2017-02-03 LAB — COMPLETE METABOLIC PANEL WITH GFR
AG RATIO: 1.5 (calc) (ref 1.0–2.5)
ALBUMIN MSPROF: 4.4 g/dL (ref 3.6–5.1)
ALT: 34 U/L — ABNORMAL HIGH (ref 6–29)
AST: 32 U/L (ref 10–35)
Alkaline phosphatase (APISO): 90 U/L (ref 33–130)
BUN: 10 mg/dL (ref 7–25)
CALCIUM: 10.2 mg/dL (ref 8.6–10.4)
CO2: 29 mmol/L (ref 20–32)
CREATININE: 0.77 mg/dL (ref 0.60–0.93)
Chloride: 105 mmol/L (ref 98–110)
GFR, EST AFRICAN AMERICAN: 89 mL/min/{1.73_m2} (ref >=60)
GFR, EST NON AFRICAN AMERICAN: 77 mL/min/{1.73_m2} (ref >=60)
GLOBULIN: 3 g/dL (ref 1.9–3.7)
Glucose, Bld: 91 mg/dL (ref 65–99)
POTASSIUM: 4.5 mmol/L (ref 3.5–5.3)
SODIUM: 140 mmol/L (ref 135–146)
TOTAL PROTEIN: 7.4 g/dL (ref 6.1–8.1)
Total Bilirubin: 0.5 mg/dL (ref 0.2–1.2)

## 2017-02-03 LAB — HEMOGLOBIN A1C
EAG (MMOL/L): 6.2 (calc)
Hgb A1c MFr Bld: 5.5 % of total Hgb (ref <5.7)
Mean Plasma Glucose: 111 (calc)

## 2017-02-03 LAB — CBC
HEMATOCRIT: 34.8 % — AB (ref 35.0–45.0)
HEMOGLOBIN: 11.6 g/dL — AB (ref 11.7–15.5)
MCH: 29.4 pg (ref 27.0–33.0)
MCHC: 33.3 g/dL (ref 32.0–36.0)
MCV: 88.3 fL (ref 80.0–100.0)
MPV: 9.4 fL (ref 7.5–12.5)
Platelets: 439 10*3/uL — ABNORMAL HIGH (ref 140–400)
RBC: 3.94 10*6/uL (ref 3.80–5.10)
RDW: 13.3 % (ref 11.0–15.0)
WBC: 8.4 10*3/uL (ref 3.8–10.8)

## 2017-02-03 LAB — LIPID PANEL
CHOL/HDL RATIO: 2.5 (calc) (ref <5.0)
Cholesterol: 141 mg/dL (ref <200)
HDL: 56 mg/dL (ref >50)
LDL Cholesterol (Calc): 69 mg/dL (calc)
NON-HDL CHOLESTEROL (CALC): 85 mg/dL (ref <130)
Triglycerides: 77 mg/dL (ref <150)

## 2017-02-03 LAB — URINALYSIS, ROUTINE W REFLEX MICROSCOPIC
BILIRUBIN URINE: NEGATIVE
GLUCOSE, UA: NEGATIVE
HGB URINE DIPSTICK: NEGATIVE
Ketones, ur: NEGATIVE
LEUKOCYTES UA: NEGATIVE
Nitrite: NEGATIVE
PROTEIN: NEGATIVE
Specific Gravity, Urine: 1.007 (ref 1.001–1.03)
pH: 7.5 (ref 5.0–8.0)

## 2017-02-03 LAB — TSH: TSH: 1.27 m[IU]/L (ref 0.40–4.50)

## 2017-04-17 IMAGING — DX DG HIP (WITH OR WITHOUT PELVIS) 2-3V*R*
3 series · 3 of 3 positions shown · non-contrast
Comparison: None.

CLINICAL DATA: Right lower back pain radiating to the hip

EXAM:
DG HIP (WITH OR WITHOUT PELVIS) 2-3V RIGHT

[pelvis ap]
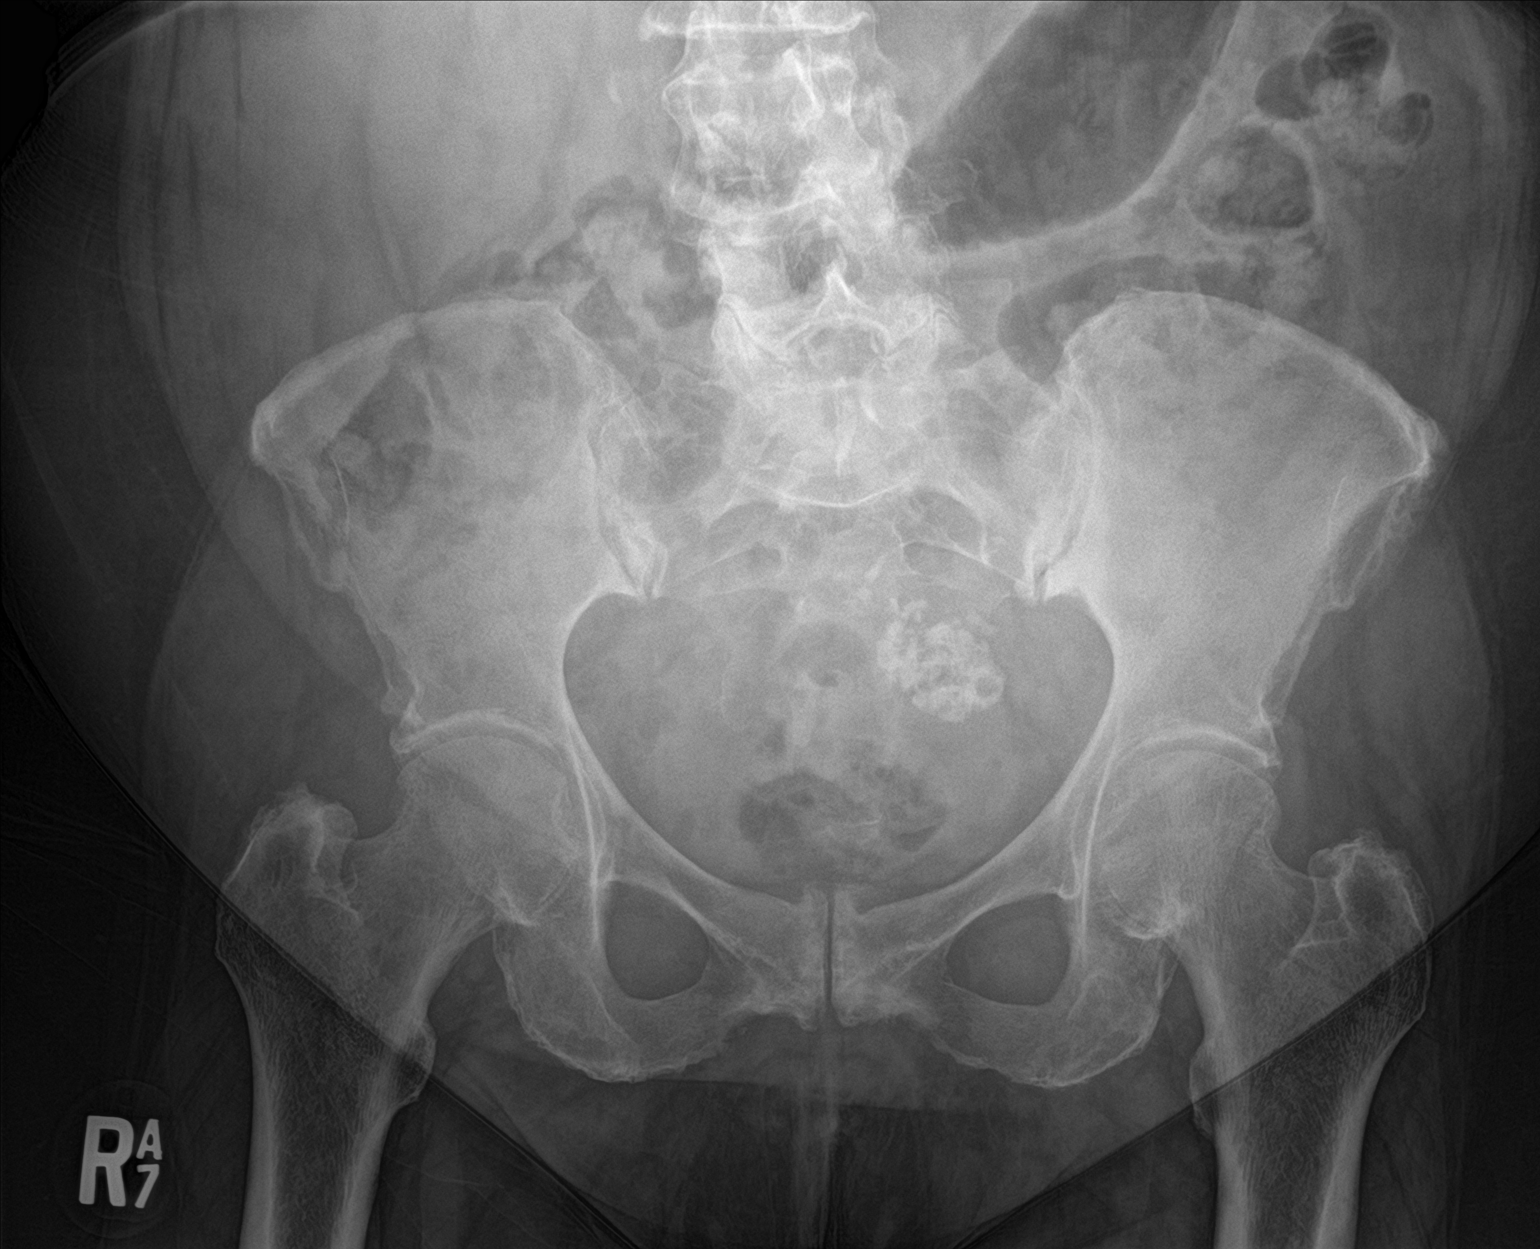

[hip ap]
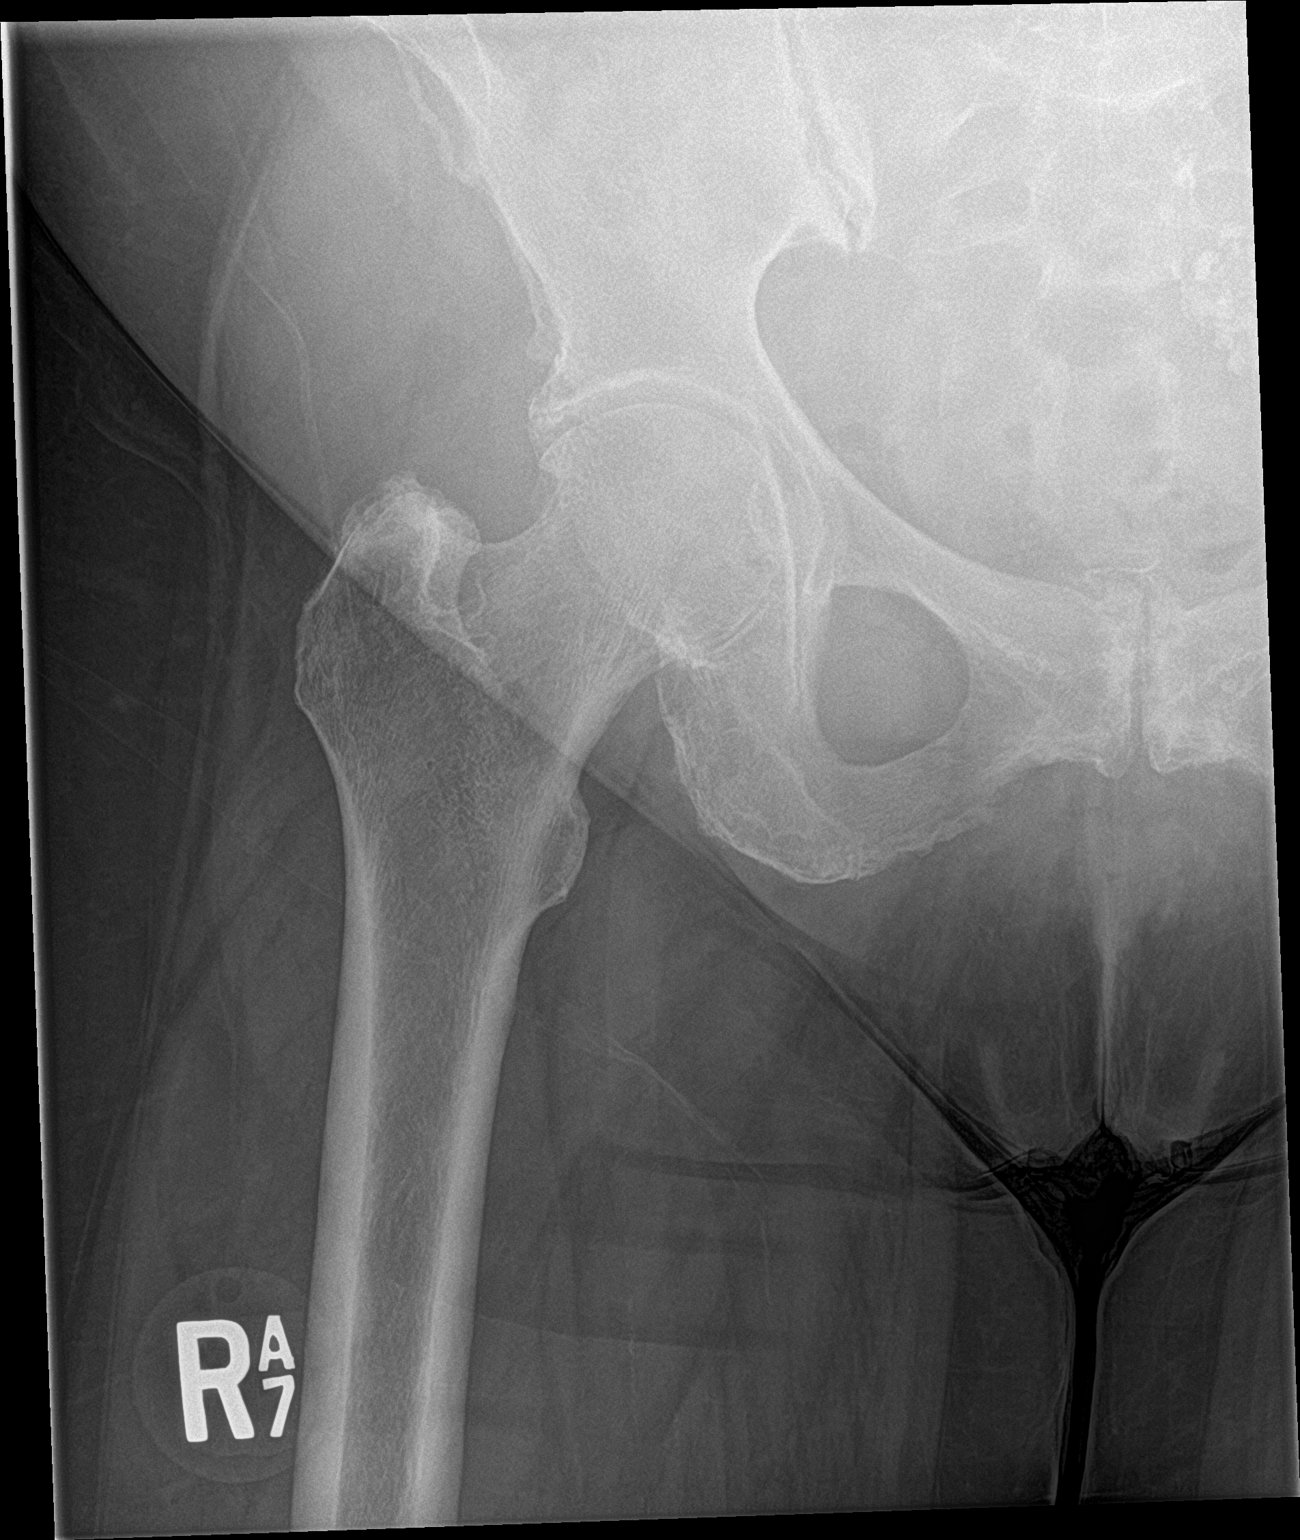

[hip lat]
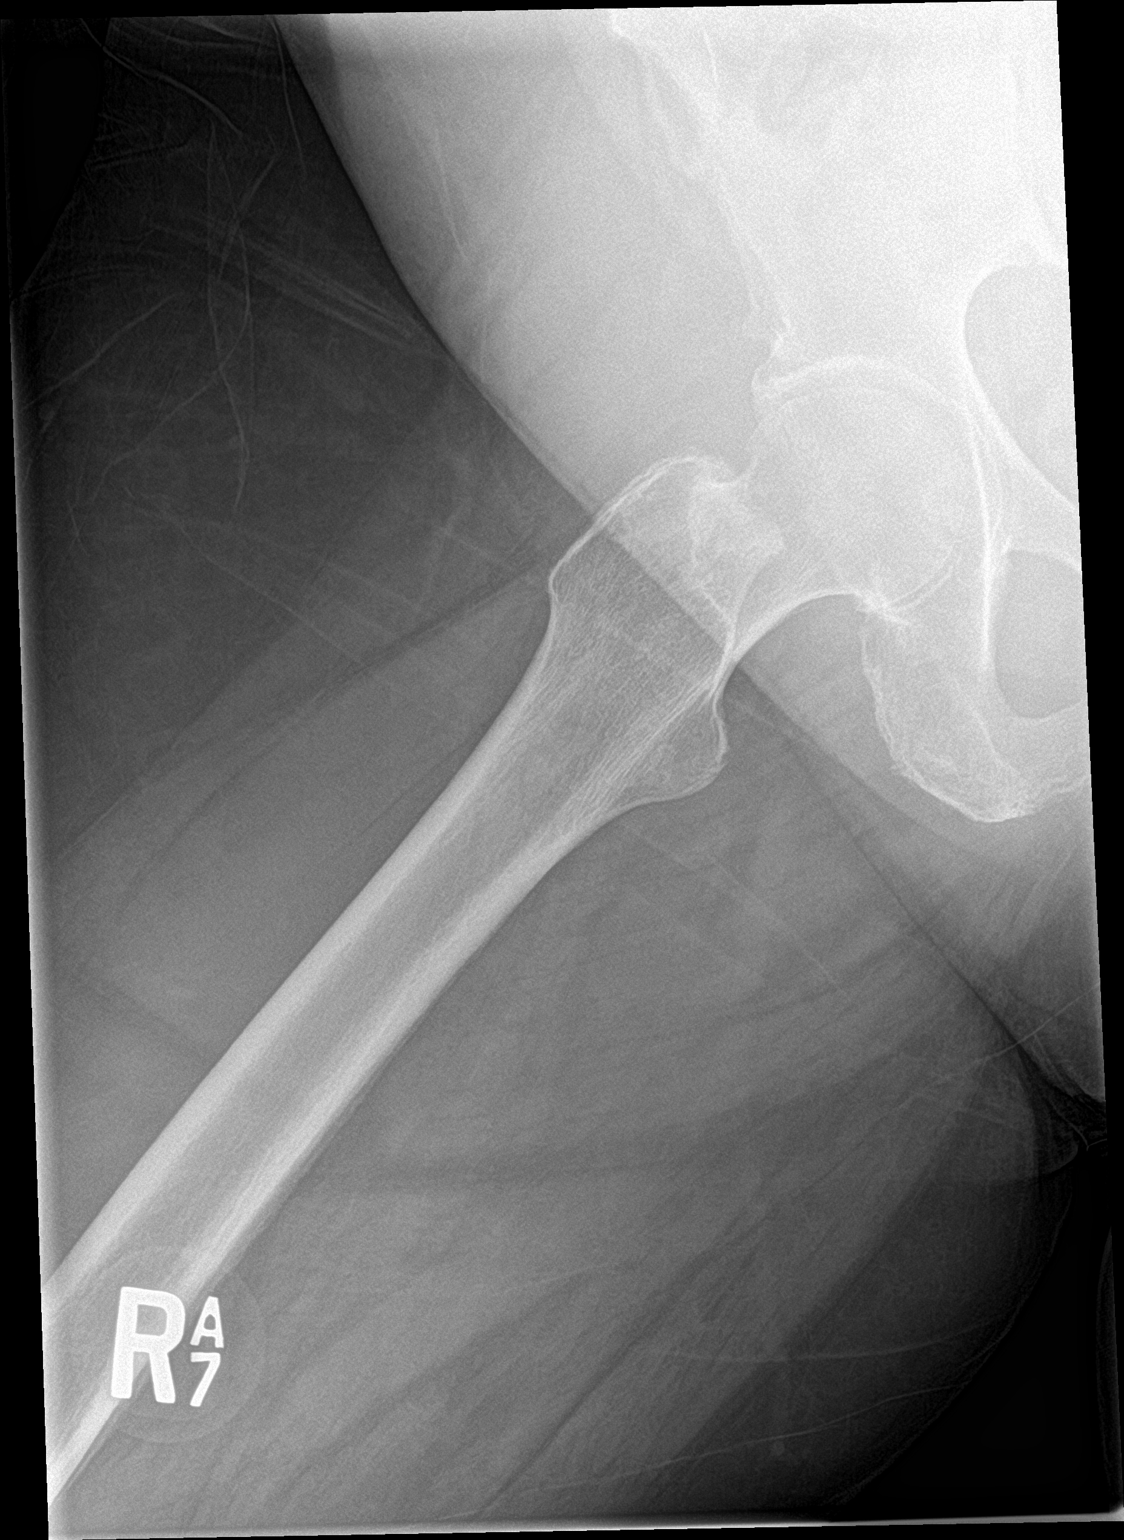

[3 of 3 positions shown; findings below may reference images not displayed]

FINDINGS: SI joints appear symmetric. Coarse calcification in the left pelvis
could relate to a calcified fibroid. Pubic symphysis is intact with
degenerative changes. No acute fracture or dislocation. Mild
degenerative changes of the right hip.
IMPRESSION: 1. No acute osseous abnormality
2. Coarse calcification in the left pelvis may represent a calcified
fibroid

## 2017-05-19 ENCOUNTER — Other Ambulatory Visit: Payer: Self-pay | Admitting: Family Medicine

## 2017-05-19 NOTE — Telephone Encounter (Signed)
Seen 11 8 18 

## 2017-06-05 ENCOUNTER — Encounter: Payer: Self-pay | Admitting: Family Medicine

## 2017-07-14 ENCOUNTER — Other Ambulatory Visit: Payer: Self-pay | Admitting: Family Medicine

## 2017-08-03 ENCOUNTER — Ambulatory Visit: Payer: Medicare Other | Admitting: Family Medicine

## 2017-10-16 ENCOUNTER — Other Ambulatory Visit: Payer: Self-pay | Admitting: Family Medicine

## 2017-11-24 ENCOUNTER — Other Ambulatory Visit: Payer: Self-pay | Admitting: Pharmacist

## 2017-11-24 NOTE — Patient Outreach (Signed)
[facility] Care Management  11/24/2017  Margot AblesBetty O Nessel 11/18/1945 [id number]   72 year old female [facility]  [facility], the following medications are due to be refilled.      Janumet  Amlodipine / Benazepril  Atorvastatin  [facility] portal no longer has patient listed as active.  Unsuccessful call attempts placed to both phone numbers [facility] and [facility] fail list as all phone numbers are inactive.  [facility].  She no longer has PCP listed on file.    [facility] as unable to contact patient at this time.    Haynes Hoehnolleen Yina Riviere, PharmD, [facility] Clinical Pharmacist [facility] [phone]
